# Patient Record
Sex: Female | Born: 1970 | Race: White | Hispanic: No | Marital: Married | Smoking: Never smoker
Health system: Southern US, Community
[De-identification: ages and names within clinical notes are randomized; demographics above are authoritative.]

## PROBLEM LIST (undated history)

## (undated) DIAGNOSIS — I10 Essential (primary) hypertension: Secondary | ICD-10-CM

## (undated) DIAGNOSIS — E785 Hyperlipidemia, unspecified: Secondary | ICD-10-CM

## (undated) DIAGNOSIS — M1990 Unspecified osteoarthritis, unspecified site: Secondary | ICD-10-CM

---

## 2014-11-10 ENCOUNTER — Ambulatory Visit (HOSPITAL_COMMUNITY)
Admission: RE | Admit: 2014-11-10 | Discharge: 2014-11-10 | Disposition: A | Discharge status: Home / Self Care | Payer: BLUE CROSS/BLUE SHIELD | Source: Ambulatory Visit | Attending: Family Medicine | Admitting: Family Medicine

## 2014-11-10 ENCOUNTER — Other Ambulatory Visit (HOSPITAL_COMMUNITY): Payer: Self-pay | Admitting: Family Medicine

## 2014-11-10 DIAGNOSIS — R05 Cough: Secondary | ICD-10-CM | POA: Insufficient documentation

## 2014-11-10 DIAGNOSIS — R059 Cough, unspecified: Secondary | ICD-10-CM

## 2015-10-12 ENCOUNTER — Other Ambulatory Visit: Payer: Self-pay | Admitting: Obstetrics and Gynecology

## 2015-10-12 DIAGNOSIS — R928 Other abnormal and inconclusive findings on diagnostic imaging of breast: Secondary | ICD-10-CM

## 2015-10-16 ENCOUNTER — Ambulatory Visit
Admission: RE | Admit: 2015-10-16 | Discharge: 2015-10-16 | Disposition: A | Discharge status: Home / Self Care | Payer: BLUE CROSS/BLUE SHIELD | Source: Ambulatory Visit | Attending: Obstetrics and Gynecology | Admitting: Obstetrics and Gynecology

## 2015-10-16 DIAGNOSIS — N6489 Other specified disorders of breast: Secondary | ICD-10-CM | POA: Diagnosis not present

## 2015-10-16 DIAGNOSIS — R928 Other abnormal and inconclusive findings on diagnostic imaging of breast: Secondary | ICD-10-CM

## 2016-06-22 DIAGNOSIS — E782 Mixed hyperlipidemia: Secondary | ICD-10-CM | POA: Diagnosis not present

## 2016-06-22 DIAGNOSIS — Z6835 Body mass index (BMI) 35.0-35.9, adult: Secondary | ICD-10-CM | POA: Diagnosis not present

## 2016-06-22 DIAGNOSIS — Z1389 Encounter for screening for other disorder: Secondary | ICD-10-CM | POA: Diagnosis not present

## 2016-06-22 DIAGNOSIS — E6609 Other obesity due to excess calories: Secondary | ICD-10-CM | POA: Diagnosis not present

## 2016-06-22 DIAGNOSIS — I10 Essential (primary) hypertension: Secondary | ICD-10-CM | POA: Diagnosis not present

## 2016-09-22 ENCOUNTER — Other Ambulatory Visit: Payer: Self-pay | Admitting: Obstetrics and Gynecology

## 2016-09-22 DIAGNOSIS — N6489 Other specified disorders of breast: Secondary | ICD-10-CM

## 2016-10-05 ENCOUNTER — Ambulatory Visit
Admission: RE | Admit: 2016-10-05 | Discharge: 2016-10-05 | Disposition: A | Discharge status: Home / Self Care | Payer: BLUE CROSS/BLUE SHIELD | Source: Ambulatory Visit | Attending: Obstetrics and Gynecology | Admitting: Obstetrics and Gynecology

## 2016-10-05 DIAGNOSIS — R922 Inconclusive mammogram: Secondary | ICD-10-CM | POA: Diagnosis not present

## 2016-10-05 DIAGNOSIS — N6489 Other specified disorders of breast: Secondary | ICD-10-CM

## 2016-10-25 DIAGNOSIS — Z01419 Encounter for gynecological examination (general) (routine) without abnormal findings: Secondary | ICD-10-CM | POA: Diagnosis not present

## 2016-10-25 DIAGNOSIS — R319 Hematuria, unspecified: Secondary | ICD-10-CM | POA: Diagnosis not present

## 2016-10-25 DIAGNOSIS — Z6835 Body mass index (BMI) 35.0-35.9, adult: Secondary | ICD-10-CM | POA: Diagnosis not present

## 2016-11-23 DIAGNOSIS — F419 Anxiety disorder, unspecified: Secondary | ICD-10-CM | POA: Diagnosis not present

## 2016-11-23 DIAGNOSIS — Z1389 Encounter for screening for other disorder: Secondary | ICD-10-CM | POA: Diagnosis not present

## 2016-11-23 DIAGNOSIS — Z6834 Body mass index (BMI) 34.0-34.9, adult: Secondary | ICD-10-CM | POA: Diagnosis not present

## 2016-11-23 DIAGNOSIS — I10 Essential (primary) hypertension: Secondary | ICD-10-CM | POA: Diagnosis not present

## 2016-11-23 DIAGNOSIS — F329 Major depressive disorder, single episode, unspecified: Secondary | ICD-10-CM | POA: Diagnosis not present

## 2017-02-27 DIAGNOSIS — M79605 Pain in left leg: Secondary | ICD-10-CM | POA: Diagnosis not present

## 2017-02-27 DIAGNOSIS — R202 Paresthesia of skin: Secondary | ICD-10-CM | POA: Diagnosis not present

## 2017-02-27 DIAGNOSIS — E6609 Other obesity due to excess calories: Secondary | ICD-10-CM | POA: Diagnosis not present

## 2017-02-27 DIAGNOSIS — Z1389 Encounter for screening for other disorder: Secondary | ICD-10-CM | POA: Diagnosis not present

## 2017-02-27 DIAGNOSIS — Z6834 Body mass index (BMI) 34.0-34.9, adult: Secondary | ICD-10-CM | POA: Diagnosis not present

## 2017-03-09 DIAGNOSIS — Z1389 Encounter for screening for other disorder: Secondary | ICD-10-CM | POA: Diagnosis not present

## 2017-03-09 DIAGNOSIS — M79605 Pain in left leg: Secondary | ICD-10-CM | POA: Diagnosis not present

## 2017-03-09 DIAGNOSIS — R202 Paresthesia of skin: Secondary | ICD-10-CM | POA: Diagnosis not present

## 2017-03-09 DIAGNOSIS — Z6834 Body mass index (BMI) 34.0-34.9, adult: Secondary | ICD-10-CM | POA: Diagnosis not present

## 2017-03-09 DIAGNOSIS — M79662 Pain in left lower leg: Secondary | ICD-10-CM | POA: Diagnosis not present

## 2017-03-14 DIAGNOSIS — M79605 Pain in left leg: Secondary | ICD-10-CM | POA: Diagnosis not present

## 2017-03-14 DIAGNOSIS — M79669 Pain in unspecified lower leg: Secondary | ICD-10-CM | POA: Diagnosis not present

## 2017-03-14 DIAGNOSIS — Z6834 Body mass index (BMI) 34.0-34.9, adult: Secondary | ICD-10-CM | POA: Diagnosis not present

## 2017-03-14 DIAGNOSIS — R202 Paresthesia of skin: Secondary | ICD-10-CM | POA: Diagnosis not present

## 2017-03-14 DIAGNOSIS — Z1389 Encounter for screening for other disorder: Secondary | ICD-10-CM | POA: Diagnosis not present

## 2017-03-29 DIAGNOSIS — I10 Essential (primary) hypertension: Secondary | ICD-10-CM | POA: Diagnosis not present

## 2017-03-29 DIAGNOSIS — M5416 Radiculopathy, lumbar region: Secondary | ICD-10-CM | POA: Diagnosis not present

## 2017-03-29 DIAGNOSIS — R208 Other disturbances of skin sensation: Secondary | ICD-10-CM | POA: Diagnosis not present

## 2017-03-29 DIAGNOSIS — E785 Hyperlipidemia, unspecified: Secondary | ICD-10-CM | POA: Diagnosis not present

## 2017-04-20 DIAGNOSIS — M5416 Radiculopathy, lumbar region: Secondary | ICD-10-CM | POA: Diagnosis not present

## 2017-04-20 DIAGNOSIS — G603 Idiopathic progressive neuropathy: Secondary | ICD-10-CM | POA: Diagnosis not present

## 2017-05-23 DIAGNOSIS — M2550 Pain in unspecified joint: Secondary | ICD-10-CM | POA: Diagnosis not present

## 2017-05-23 DIAGNOSIS — F419 Anxiety disorder, unspecified: Secondary | ICD-10-CM | POA: Diagnosis not present

## 2017-05-23 DIAGNOSIS — E782 Mixed hyperlipidemia: Secondary | ICD-10-CM | POA: Diagnosis not present

## 2017-05-23 DIAGNOSIS — E785 Hyperlipidemia, unspecified: Secondary | ICD-10-CM | POA: Diagnosis not present

## 2017-05-23 DIAGNOSIS — G608 Other hereditary and idiopathic neuropathies: Secondary | ICD-10-CM | POA: Diagnosis not present

## 2017-05-23 DIAGNOSIS — Z6835 Body mass index (BMI) 35.0-35.9, adult: Secondary | ICD-10-CM | POA: Diagnosis not present

## 2017-05-23 DIAGNOSIS — I10 Essential (primary) hypertension: Secondary | ICD-10-CM | POA: Diagnosis not present

## 2017-05-23 DIAGNOSIS — Z1389 Encounter for screening for other disorder: Secondary | ICD-10-CM | POA: Diagnosis not present

## 2017-10-11 NOTE — Progress Notes (Deleted)
   Office Visit Note  Patient: Shelby Coleman             Date of Birth: 02/20/1971           MRN: [id number]             PCP: Assunta FoundGolding, John, MD Referring: Assunta FoundGolding, John, MD Visit Date: 10/25/2017 Occupation: @GUAROCC @    Subjective:  No chief complaint on file.   History of Present Illness: Shelby Coleman is a 47 y.o. female ***   Activities of Daily Living:  Patient reports morning stiffness for *** {minute/hour:19697}.   Patient {ACTIONS;DENIES/REPORTS:21021675::"Denies"} nocturnal pain.  Difficulty dressing/grooming: {ACTIONS;DENIES/REPORTS:21021675::"Denies"} Difficulty climbing stairs: {ACTIONS;DENIES/REPORTS:21021675::"Denies"} Difficulty getting out of chair: {ACTIONS;DENIES/REPORTS:21021675::"Denies"} Difficulty using hands for taps, buttons, cutlery, and/or writing: {ACTIONS;DENIES/REPORTS:21021675::"Denies"}   No Rheumatology ROS completed.   PMFS History:  There are no active problems to display for this patient.   No past medical history on file.  Family History  Problem Relation Age of Onset  . Breast cancer Maternal Grandmother    *** The histories are not reviewed yet. Please review them in the "History" navigator section and refresh this SmartLink. Social History   Social History Narrative  . Not on file     Objective: Vital Signs: There were no vitals taken for this visit.   Physical Exam   Musculoskeletal Exam: ***  CDAI Exam: No CDAI exam completed.    Investigation: No additional findings.   Imaging: No results found.  Speciality Comments: No specialty comments available.    Procedures:  No procedures performed Allergies: Patient has no allergy information on record.   Assessment / Plan:     Visit Diagnoses: Polyarthralgia - on Mobic 15 mg dailyLabs were not included  Essential hypertension  History of hyperlipidemia  History of anxiety  Idiopathic peripheral neuropathy    Orders: No orders of the defined types  were placed in this encounter.  No orders of the defined types were placed in this encounter.   Face-to-face time spent with patient was *** minutes. 50% of time was spent in counseling and coordination of care.  Follow-Up Instructions: No follow-ups on file.   Gearldine Bienenstockaylor M Maylon Sailors, PA-C  Note - This record has been created using Dragon software.  Chart creation errors have been sought, but may not always  have been located. Such creation errors do not reflect on  the standard of medical care.

## 2017-10-25 ENCOUNTER — Ambulatory Visit: Payer: Self-pay | Admitting: Rheumatology

## 2017-11-13 NOTE — Progress Notes (Deleted)
   Office Visit Note  Patient: Shelby Coleman             Date of Birth: [birth year]           MRN: [id number]             PCP: Assunta Found, MD Referring: Assunta Found, MD Visit Date: 11/27/2017 Occupation: @    Subjective:  No chief complaint on file.   History of Present Illness: Shelby Coleman is a 47 y.o. female ***   Activities of Daily Living:  Patient reports morning stiffness for *** {minute/hour:19697}.   Patient {ACTIONS;DENIES/REPORTS:21021675::"Denies"} nocturnal pain.  Difficulty dressing/grooming: {ACTIONS;DENIES/REPORTS:21021675::"Denies"} Difficulty climbing stairs: {ACTIONS;DENIES/REPORTS:21021675::"Denies"} Difficulty getting out of chair: {ACTIONS;DENIES/REPORTS:21021675::"Denies"} Difficulty using hands for taps, buttons, cutlery, and/or writing: {ACTIONS;DENIES/REPORTS:21021675::"Denies"}   No Rheumatology ROS completed.   PMFS History:  There are no active problems to display for this patient.   No past medical history on file.  Family History  Problem Relation Age of Onset  . Breast cancer Maternal Grandmother    *** The histories are not reviewed yet. Please review them in the "History" navigator section and refresh this SmartLink. Social History   Social History Narrative  . Not on file     Objective: Vital Signs: There were no vitals taken for this visit.   Physical Exam   Musculoskeletal Exam: ***  CDAI Exam: No CDAI exam completed.    Investigation: No additional findings.   Imaging: No results found.  Speciality Comments: No specialty comments available.    Procedures:  No procedures performed Allergies: Patient has no allergy information on record.   Assessment / Plan:     Visit Diagnoses: Polyarthralgia  Idiopathic peripheral neuropathy  History of hypertension  History of hyperlipidemia  History of anxiety    Orders: No orders of the defined types were placed in this encounter.  No orders of  the defined types were placed in this encounter.   Face-to-face time spent with patient was *** minutes. 50% of time was spent in counseling and coordination of care.  Follow-Up Instructions: No follow-ups on file.   Gearldine Bienenstock, PA-C  Note - This record has been created using Dragon software.  Chart creation errors have been sought, but may not always  have been located. Such creation errors do not reflect on  the standard of medical care.

## 2017-11-23 ENCOUNTER — Ambulatory Visit: Payer: Self-pay | Admitting: Rheumatology

## 2017-11-27 ENCOUNTER — Ambulatory Visit: Payer: Self-pay | Admitting: Rheumatology

## 2017-12-28 ENCOUNTER — Ambulatory Visit: Payer: Self-pay | Admitting: Physician Assistant

## 2018-05-23 DIAGNOSIS — E6609 Other obesity due to excess calories: Secondary | ICD-10-CM | POA: Diagnosis not present

## 2018-05-23 DIAGNOSIS — Z0000 Encounter for general adult medical examination without abnormal findings: Secondary | ICD-10-CM | POA: Diagnosis not present

## 2018-05-23 DIAGNOSIS — J029 Acute pharyngitis, unspecified: Secondary | ICD-10-CM | POA: Diagnosis not present

## 2018-05-23 DIAGNOSIS — Z1389 Encounter for screening for other disorder: Secondary | ICD-10-CM | POA: Diagnosis not present

## 2018-05-23 DIAGNOSIS — Z6835 Body mass index (BMI) 35.0-35.9, adult: Secondary | ICD-10-CM | POA: Diagnosis not present

## 2018-05-23 DIAGNOSIS — Z0001 Encounter for general adult medical examination with abnormal findings: Secondary | ICD-10-CM | POA: Diagnosis not present

## 2018-07-13 ENCOUNTER — Other Ambulatory Visit: Payer: Self-pay | Admitting: Obstetrics and Gynecology

## 2018-07-13 DIAGNOSIS — N6489 Other specified disorders of breast: Secondary | ICD-10-CM

## 2018-07-20 ENCOUNTER — Ambulatory Visit
Admission: RE | Admit: 2018-07-20 | Discharge: 2018-07-20 | Disposition: A | Discharge status: Home / Self Care | Payer: BLUE CROSS/BLUE SHIELD | Source: Ambulatory Visit | Attending: Obstetrics and Gynecology | Admitting: Obstetrics and Gynecology

## 2018-07-20 ENCOUNTER — Other Ambulatory Visit: Payer: Self-pay | Admitting: Obstetrics and Gynecology

## 2018-07-20 DIAGNOSIS — R922 Inconclusive mammogram: Secondary | ICD-10-CM | POA: Diagnosis not present

## 2018-07-20 DIAGNOSIS — N6489 Other specified disorders of breast: Secondary | ICD-10-CM

## 2018-07-26 DIAGNOSIS — R0683 Snoring: Secondary | ICD-10-CM | POA: Diagnosis not present

## 2018-08-08 DIAGNOSIS — G4733 Obstructive sleep apnea (adult) (pediatric): Secondary | ICD-10-CM | POA: Diagnosis not present

## 2018-08-14 DIAGNOSIS — Z6834 Body mass index (BMI) 34.0-34.9, adult: Secondary | ICD-10-CM | POA: Diagnosis not present

## 2018-08-14 DIAGNOSIS — E6609 Other obesity due to excess calories: Secondary | ICD-10-CM | POA: Diagnosis not present

## 2018-08-14 DIAGNOSIS — R6883 Chills (without fever): Secondary | ICD-10-CM | POA: Diagnosis not present

## 2018-08-14 DIAGNOSIS — J019 Acute sinusitis, unspecified: Secondary | ICD-10-CM | POA: Diagnosis not present

## 2018-08-14 DIAGNOSIS — J343 Hypertrophy of nasal turbinates: Secondary | ICD-10-CM | POA: Diagnosis not present

## 2018-08-14 DIAGNOSIS — R05 Cough: Secondary | ICD-10-CM | POA: Diagnosis not present

## 2018-08-14 DIAGNOSIS — E781 Pure hyperglyceridemia: Secondary | ICD-10-CM | POA: Diagnosis not present

## 2018-08-14 DIAGNOSIS — Z1389 Encounter for screening for other disorder: Secondary | ICD-10-CM | POA: Diagnosis not present

## 2018-08-20 DIAGNOSIS — G4733 Obstructive sleep apnea (adult) (pediatric): Secondary | ICD-10-CM | POA: Diagnosis not present

## 2018-09-12 DIAGNOSIS — Z3202 Encounter for pregnancy test, result negative: Secondary | ICD-10-CM | POA: Diagnosis not present

## 2018-09-12 DIAGNOSIS — Z6836 Body mass index (BMI) 36.0-36.9, adult: Secondary | ICD-10-CM | POA: Diagnosis not present

## 2018-09-12 DIAGNOSIS — Z01419 Encounter for gynecological examination (general) (routine) without abnormal findings: Secondary | ICD-10-CM | POA: Diagnosis not present

## 2018-09-18 DIAGNOSIS — G4733 Obstructive sleep apnea (adult) (pediatric): Secondary | ICD-10-CM | POA: Diagnosis not present

## 2018-10-19 DIAGNOSIS — G4733 Obstructive sleep apnea (adult) (pediatric): Secondary | ICD-10-CM | POA: Diagnosis not present

## 2018-11-18 DIAGNOSIS — G4733 Obstructive sleep apnea (adult) (pediatric): Secondary | ICD-10-CM | POA: Diagnosis not present

## 2018-12-19 DIAGNOSIS — G4733 Obstructive sleep apnea (adult) (pediatric): Secondary | ICD-10-CM | POA: Diagnosis not present

## 2019-01-12 ENCOUNTER — Emergency Department (HOSPITAL_COMMUNITY): Payer: BC Managed Care – PPO | Admitting: Anesthesiology

## 2019-01-12 ENCOUNTER — Encounter (HOSPITAL_COMMUNITY)
Admission: EM | Disposition: A | Discharge status: Home / Self Care | Payer: Self-pay | Source: Home / Self Care | Attending: Emergency Medicine

## 2019-01-12 ENCOUNTER — Emergency Department (HOSPITAL_COMMUNITY): Payer: BC Managed Care – PPO

## 2019-01-12 ENCOUNTER — Other Ambulatory Visit: Payer: Self-pay

## 2019-01-12 ENCOUNTER — Encounter (HOSPITAL_COMMUNITY): Payer: Self-pay | Admitting: Emergency Medicine

## 2019-01-12 ENCOUNTER — Ambulatory Visit (HOSPITAL_COMMUNITY)
Admission: EM | Admit: 2019-01-12 | Discharge: 2019-01-13 | Disposition: A | Discharge status: Home / Self Care | Payer: BC Managed Care – PPO | Attending: Emergency Medicine | Admitting: Emergency Medicine

## 2019-01-12 DIAGNOSIS — Z79899 Other long term (current) drug therapy: Secondary | ICD-10-CM | POA: Insufficient documentation

## 2019-01-12 DIAGNOSIS — S82892A Other fracture of left lower leg, initial encounter for closed fracture: Secondary | ICD-10-CM | POA: Diagnosis not present

## 2019-01-12 DIAGNOSIS — J45909 Unspecified asthma, uncomplicated: Secondary | ICD-10-CM | POA: Insufficient documentation

## 2019-01-12 DIAGNOSIS — Z791 Long term (current) use of non-steroidal anti-inflammatories (NSAID): Secondary | ICD-10-CM | POA: Diagnosis not present

## 2019-01-12 DIAGNOSIS — G4730 Sleep apnea, unspecified: Secondary | ICD-10-CM | POA: Insufficient documentation

## 2019-01-12 DIAGNOSIS — S8262XA Displaced fracture of lateral malleolus of left fibula, initial encounter for closed fracture: Secondary | ICD-10-CM

## 2019-01-12 DIAGNOSIS — E785 Hyperlipidemia, unspecified: Secondary | ICD-10-CM | POA: Insufficient documentation

## 2019-01-12 DIAGNOSIS — Z6835 Body mass index (BMI) 35.0-35.9, adult: Secondary | ICD-10-CM | POA: Insufficient documentation

## 2019-01-12 DIAGNOSIS — Z803 Family history of malignant neoplasm of breast: Secondary | ICD-10-CM | POA: Insufficient documentation

## 2019-01-12 DIAGNOSIS — S89392A Other physeal fracture of lower end of left fibula, initial encounter for closed fracture: Secondary | ICD-10-CM | POA: Diagnosis not present

## 2019-01-12 DIAGNOSIS — W19XXXA Unspecified fall, initial encounter: Secondary | ICD-10-CM | POA: Diagnosis not present

## 2019-01-12 DIAGNOSIS — M25372 Other instability, left ankle: Secondary | ICD-10-CM | POA: Diagnosis not present

## 2019-01-12 DIAGNOSIS — S82832A Other fracture of upper and lower end of left fibula, initial encounter for closed fracture: Secondary | ICD-10-CM | POA: Diagnosis not present

## 2019-01-12 DIAGNOSIS — M25552 Pain in left hip: Secondary | ICD-10-CM | POA: Diagnosis not present

## 2019-01-12 DIAGNOSIS — Z7982 Long term (current) use of aspirin: Secondary | ICD-10-CM | POA: Diagnosis not present

## 2019-01-12 DIAGNOSIS — S79912A Unspecified injury of left hip, initial encounter: Secondary | ICD-10-CM | POA: Diagnosis not present

## 2019-01-12 DIAGNOSIS — M25562 Pain in left knee: Secondary | ICD-10-CM | POA: Diagnosis not present

## 2019-01-12 DIAGNOSIS — Z1159 Encounter for screening for other viral diseases: Secondary | ICD-10-CM | POA: Diagnosis not present

## 2019-01-12 DIAGNOSIS — E669 Obesity, unspecified: Secondary | ICD-10-CM | POA: Insufficient documentation

## 2019-01-12 DIAGNOSIS — S8263XA Displaced fracture of lateral malleolus of unspecified fibula, initial encounter for closed fracture: Secondary | ICD-10-CM | POA: Diagnosis present

## 2019-01-12 DIAGNOSIS — S8992XA Unspecified injury of left lower leg, initial encounter: Secondary | ICD-10-CM | POA: Diagnosis not present

## 2019-01-12 DIAGNOSIS — I10 Essential (primary) hypertension: Secondary | ICD-10-CM | POA: Insufficient documentation

## 2019-01-12 DIAGNOSIS — M1990 Unspecified osteoarthritis, unspecified site: Secondary | ICD-10-CM | POA: Insufficient documentation

## 2019-01-12 DIAGNOSIS — Z03818 Encounter for observation for suspected exposure to other biological agents ruled out: Secondary | ICD-10-CM | POA: Diagnosis not present

## 2019-01-12 HISTORY — DX: Hyperlipidemia, unspecified: E78.5

## 2019-01-12 HISTORY — PX: ORIF ANKLE FRACTURE: SHX5408

## 2019-01-12 HISTORY — DX: Unspecified osteoarthritis, unspecified site: M19.90

## 2019-01-12 HISTORY — DX: Essential (primary) hypertension: I10

## 2019-01-12 LAB — BASIC METABOLIC PANEL
Anion gap: 10 (ref 5–15)
BUN: 11 mg/dL (ref 6–20)
CO2: 21 mmol/L — ABNORMAL LOW (ref 22–32)
Calcium: 9.3 mg/dL (ref 8.9–10.3)
Chloride: 106 mmol/L (ref 98–111)
Creatinine, Ser: 0.96 mg/dL (ref 0.44–1.00)
GFR calc Af Amer: >60 mL/min (ref >60)
GFR calc non Af Amer: >60 mL/min (ref >60)
Glucose, Bld: 103 mg/dL — ABNORMAL HIGH (ref 70–99)
Potassium: 3.9 mmol/L (ref 3.5–5.1)
Sodium: 137 mmol/L (ref 135–145)

## 2019-01-12 LAB — CBC
HCT: 39.5 % (ref 36.0–46.0)
Hemoglobin: 13.7 g/dL (ref 12.0–15.0)
MCH: 29.9 pg (ref 26.0–34.0)
MCHC: 34.7 g/dL (ref 30.0–36.0)
MCV: 86.2 fL (ref 80.0–100.0)
Platelets: 318 10*3/uL (ref 150–400)
RBC: 4.58 MIL/uL (ref 3.87–5.11)
RDW: 13.2 % (ref 11.5–15.5)
WBC: 16.3 10*3/uL — ABNORMAL HIGH (ref 4.0–10.5)
nRBC: 0 % (ref 0.0–0.2)

## 2019-01-12 LAB — SARS CORONAVIRUS 2 BY RT PCR (HOSPITAL ORDER, PERFORMED IN ~~LOC~~ HOSPITAL LAB): SARS Coronavirus 2: NEGATIVE

## 2019-01-12 SURGERY — OPEN REDUCTION INTERNAL FIXATION (ORIF) ANKLE FRACTURE
Anesthesia: General | Site: Ankle | Laterality: Left

## 2019-01-12 MED ORDER — SCOPOLAMINE 1 MG/3DAYS TD PT72
MEDICATED_PATCH | TRANSDERMAL | Status: DC | PRN
  Administered 2019-01-12: 1 via TRANSDERMAL

## 2019-01-12 MED ORDER — FENTANYL CITRATE (PF) 250 MCG/5ML IJ SOLN
INTRAMUSCULAR | Status: AC
  Filled 2019-01-12: qty 5

## 2019-01-12 MED ORDER — ONDANSETRON HCL 4 MG/2ML IJ SOLN: 4.0000 mg | Freq: Four times a day (QID) | INTRAMUSCULAR | Status: DC | PRN

## 2019-01-12 MED ORDER — CHLORHEXIDINE GLUCONATE 4 % EX LIQD: 60.0000 mL | Freq: Once | CUTANEOUS | Status: DC

## 2019-01-12 MED ORDER — ESCITALOPRAM OXALATE 10 MG PO TABS
20.0000 mg | ORAL_TABLET | Freq: Every day | ORAL | Status: DC
  Administered 2019-01-13: 09:00:00 20 mg via ORAL
  Filled 2019-01-12: qty 2

## 2019-01-12 MED ORDER — 0.9 % SODIUM CHLORIDE (POUR BTL) OPTIME
TOPICAL | Status: DC | PRN
  Administered 2019-01-12: 17:00:00 1000 mL

## 2019-01-12 MED ORDER — BISACODYL 10 MG RE SUPP: 10.0000 mg | Freq: Every day | RECTAL | Status: DC | PRN

## 2019-01-12 MED ORDER — METHOCARBAMOL 500 MG PO TABS
500.0000 mg | ORAL_TABLET | Freq: Four times a day (QID) | ORAL | Status: DC | PRN
  Filled 2019-01-12: qty 1

## 2019-01-12 MED ORDER — METOCLOPRAMIDE HCL 5 MG/ML IJ SOLN: 5.0000 mg | Freq: Three times a day (TID) | INTRAMUSCULAR | Status: DC | PRN

## 2019-01-12 MED ORDER — SCOPOLAMINE 1 MG/3DAYS TD PT72
1.0000 | MEDICATED_PATCH | Freq: Once | TRANSDERMAL | Status: DC
  Administered 2019-01-13: 1.5 mg via TRANSDERMAL
  Filled 2019-01-12 (×2): qty 1

## 2019-01-12 MED ORDER — OXYCODONE HCL 5 MG PO TABS: 10.0000 mg | ORAL_TABLET | ORAL | Status: DC | PRN

## 2019-01-12 MED ORDER — CELECOXIB 200 MG PO CAPS
ORAL_CAPSULE | ORAL | Status: AC
  Administered 2019-01-12: 17:00:00 200 mg via ORAL
  Filled 2019-01-12: qty 1

## 2019-01-12 MED ORDER — POLYETHYLENE GLYCOL 3350 17 G PO PACK: 17.0000 g | PACK | Freq: Every day | ORAL | Status: DC | PRN

## 2019-01-12 MED ORDER — CEFAZOLIN SODIUM-DEXTROSE 2-4 GM/100ML-% IV SOLN
INTRAVENOUS | Status: AC
  Filled 2019-01-12: qty 100

## 2019-01-12 MED ORDER — CEFAZOLIN SODIUM-DEXTROSE 2-3 GM-%(50ML) IV SOLR
INTRAVENOUS | Status: DC | PRN
  Administered 2019-01-12: 2 g via INTRAVENOUS

## 2019-01-12 MED ORDER — SCOPOLAMINE 1 MG/3DAYS TD PT72
MEDICATED_PATCH | TRANSDERMAL | Status: AC
  Filled 2019-01-12: qty 1

## 2019-01-12 MED ORDER — OXYCODONE HCL 5 MG PO TABS: 5.0000 mg | ORAL_TABLET | ORAL | Status: DC | PRN

## 2019-01-12 MED ORDER — FENTANYL CITRATE (PF) 100 MCG/2ML IJ SOLN
25.0000 ug | INTRAMUSCULAR | Status: DC | PRN
  Administered 2019-01-12 (×2): 50 ug via INTRAVENOUS

## 2019-01-12 MED ORDER — FENTANYL CITRATE (PF) 100 MCG/2ML IJ SOLN
INTRAMUSCULAR | Status: DC | PRN
  Administered 2019-01-12: 150 ug via INTRAVENOUS

## 2019-01-12 MED ORDER — ASPIRIN EC 325 MG PO TBEC
325.0000 mg | DELAYED_RELEASE_TABLET | Freq: Every day | ORAL | Status: DC
  Administered 2019-01-12 – 2019-01-13 (×2): 325 mg via ORAL
  Filled 2019-01-12: qty 1

## 2019-01-12 MED ORDER — ENSURE PRE-SURGERY PO LIQD
296.0000 mL | Freq: Once | ORAL | Status: DC
  Filled 2019-01-12: qty 296

## 2019-01-12 MED ORDER — ONDANSETRON HCL 4 MG PO TABS: 4.0000 mg | ORAL_TABLET | Freq: Four times a day (QID) | ORAL | Status: DC | PRN

## 2019-01-12 MED ORDER — MIDAZOLAM HCL 2 MG/2ML IJ SOLN
INTRAMUSCULAR | Status: AC
  Filled 2019-01-12: qty 2

## 2019-01-12 MED ORDER — ACETAMINOPHEN 500 MG PO TABS
1000.0000 mg | ORAL_TABLET | Freq: Once | ORAL | Status: AC
  Administered 2019-01-12: 22:00:00 1000 mg via ORAL

## 2019-01-12 MED ORDER — ACETAMINOPHEN 500 MG PO TABS
ORAL_TABLET | ORAL | Status: AC
  Filled 2019-01-12: qty 2

## 2019-01-12 MED ORDER — DOCUSATE SODIUM 100 MG PO CAPS
100.0000 mg | ORAL_CAPSULE | Freq: Two times a day (BID) | ORAL | Status: DC
  Administered 2019-01-12 – 2019-01-13 (×2): 100 mg via ORAL
  Filled 2019-01-12 (×2): qty 1

## 2019-01-12 MED ORDER — METHOCARBAMOL 1000 MG/10ML IJ SOLN
500.0000 mg | Freq: Four times a day (QID) | INTRAVENOUS | Status: DC | PRN
  Filled 2019-01-12: qty 5

## 2019-01-12 MED ORDER — CEFAZOLIN SODIUM-DEXTROSE 2-4 GM/100ML-% IV SOLN: 2.0000 g | INTRAVENOUS | Status: AC

## 2019-01-12 MED ORDER — CELECOXIB 200 MG PO CAPS
200.0000 mg | ORAL_CAPSULE | Freq: Once | ORAL | Status: AC
  Administered 2019-01-12: 200 mg via ORAL

## 2019-01-12 MED ORDER — LACTATED RINGERS IV SOLN
INTRAVENOUS | Status: DC | PRN
  Administered 2019-01-12: 17:00:00 via INTRAVENOUS

## 2019-01-12 MED ORDER — LIDOCAINE 2% (20 MG/ML) 5 ML SYRINGE
INTRAMUSCULAR | Status: DC | PRN
  Administered 2019-01-12: 100 mg via INTRAVENOUS

## 2019-01-12 MED ORDER — SUGAMMADEX SODIUM 200 MG/2ML IV SOLN
INTRAVENOUS | Status: DC | PRN
  Administered 2019-01-12: 181.4 mg via INTRAVENOUS

## 2019-01-12 MED ORDER — ACETAMINOPHEN 325 MG PO TABS
ORAL_TABLET | ORAL | Status: DC | PRN
  Administered 2019-01-12: 1000 mg via ORAL

## 2019-01-12 MED ORDER — LOSARTAN POTASSIUM 50 MG PO TABS
100.0000 mg | ORAL_TABLET | Freq: Every day | ORAL | Status: DC
  Administered 2019-01-13: 09:00:00 100 mg via ORAL
  Filled 2019-01-12: qty 2

## 2019-01-12 MED ORDER — CEFAZOLIN SODIUM-DEXTROSE 1-4 GM/50ML-% IV SOLN
1.0000 g | Freq: Four times a day (QID) | INTRAVENOUS | Status: AC
  Administered 2019-01-13 (×3): 1 g via INTRAVENOUS
  Filled 2019-01-12 (×3): qty 50

## 2019-01-12 MED ORDER — PROPOFOL 10 MG/ML IV BOLUS
INTRAVENOUS | Status: DC | PRN
  Administered 2019-01-12: 150 mg via INTRAVENOUS

## 2019-01-12 MED ORDER — ROCURONIUM BROMIDE 100 MG/10ML IV SOLN
INTRAVENOUS | Status: DC | PRN
  Administered 2019-01-12: 100 mg via INTRAVENOUS

## 2019-01-12 MED ORDER — METOCLOPRAMIDE HCL 5 MG PO TABS: 5.0000 mg | ORAL_TABLET | Freq: Three times a day (TID) | ORAL | Status: DC | PRN

## 2019-01-12 MED ORDER — PROPOFOL 10 MG/ML IV BOLUS
INTRAVENOUS | Status: AC
  Filled 2019-01-12: qty 40

## 2019-01-12 MED ORDER — MAGNESIUM CITRATE PO SOLN: 1.0000 | Freq: Once | ORAL | Status: DC | PRN

## 2019-01-12 MED ORDER — AMLODIPINE BESYLATE 10 MG PO TABS
10.0000 mg | ORAL_TABLET | Freq: Every day | ORAL | Status: DC
  Administered 2019-01-13: 10 mg via ORAL
  Filled 2019-01-12: qty 1

## 2019-01-12 MED ORDER — POVIDONE-IODINE 10 % EX SWAB
2.0000 "application " | Freq: Once | CUTANEOUS | Status: AC
  Administered 2019-01-12: 2 via TOPICAL

## 2019-01-12 MED ORDER — ACETAMINOPHEN 325 MG PO TABS: 325.0000 mg | ORAL_TABLET | Freq: Four times a day (QID) | ORAL | Status: DC | PRN

## 2019-01-12 MED ORDER — DEXAMETHASONE SODIUM PHOSPHATE 10 MG/ML IJ SOLN
INTRAMUSCULAR | Status: DC | PRN
  Administered 2019-01-12: 10 mg via INTRAVENOUS

## 2019-01-12 MED ORDER — ACETAMINOPHEN 10 MG/ML IV SOLN
INTRAVENOUS | Status: AC
  Filled 2019-01-12: qty 100

## 2019-01-12 MED ORDER — GABAPENTIN 300 MG PO CAPS
300.0000 mg | ORAL_CAPSULE | Freq: Once | ORAL | Status: AC
  Administered 2019-01-12: 300 mg via ORAL

## 2019-01-12 MED ORDER — HYDROMORPHONE HCL 1 MG/ML IJ SOLN: 0.5000 mg | INTRAMUSCULAR | Status: DC | PRN

## 2019-01-12 MED ORDER — PROMETHAZINE HCL 25 MG/ML IJ SOLN: 6.2500 mg | INTRAMUSCULAR | Status: DC | PRN

## 2019-01-12 MED ORDER — ONDANSETRON HCL 4 MG/2ML IJ SOLN
INTRAMUSCULAR | Status: DC | PRN
  Administered 2019-01-12: 20 mg via INTRAVENOUS
  Administered 2019-01-12: 4 mg via INTRAVENOUS

## 2019-01-12 MED ORDER — HYDROCODONE-ACETAMINOPHEN 5-325 MG PO TABS: 1.0000 | ORAL_TABLET | ORAL | 0 refills | Status: AC | PRN

## 2019-01-12 MED ORDER — GABAPENTIN 300 MG PO CAPS
ORAL_CAPSULE | ORAL | Status: AC
  Administered 2019-01-12: 17:00:00 300 mg via ORAL
  Filled 2019-01-12: qty 1

## 2019-01-12 MED ORDER — FENTANYL CITRATE (PF) 100 MCG/2ML IJ SOLN
INTRAMUSCULAR | Status: AC
  Filled 2019-01-12: qty 2

## 2019-01-12 MED ORDER — ATORVASTATIN CALCIUM 10 MG PO TABS
20.0000 mg | ORAL_TABLET | Freq: Every day | ORAL | Status: DC
  Administered 2019-01-13: 09:00:00 20 mg via ORAL
  Filled 2019-01-12: qty 2

## 2019-01-12 MED ORDER — SODIUM CHLORIDE 0.9 % IV SOLN
INTRAVENOUS | Status: DC
  Administered 2019-01-12: 22:00:00 via INTRAVENOUS

## 2019-01-12 SURGICAL SUPPLY — 47 items
BANDAGE ESMARK 6X9 LF (GAUZE/BANDAGES/DRESSINGS) ×1 IMPLANT
BIT DRILL 2.5X110 QC LCP DISP (BIT) ×3 IMPLANT
BNDG COHESIVE 4X5 TAN STRL (GAUZE/BANDAGES/DRESSINGS) ×3 IMPLANT
BNDG ESMARK 6X9 LF (GAUZE/BANDAGES/DRESSINGS) ×3
BNDG GAUZE ELAST 4 BULKY (GAUZE/BANDAGES/DRESSINGS) ×3 IMPLANT
COVER SURGICAL LIGHT HANDLE (MISCELLANEOUS) ×3 IMPLANT
COVER WAND RF STERILE (DRAPES) ×3 IMPLANT
DRAPE OEC MINIVIEW 54X84 (DRAPES) ×3 IMPLANT
DRAPE U-SHAPE 47X51 STRL (DRAPES) ×3 IMPLANT
DRSG ADAPTIC 3X8 NADH LF (GAUZE/BANDAGES/DRESSINGS) ×3 IMPLANT
DRSG PAD ABDOMINAL 8X10 ST (GAUZE/BANDAGES/DRESSINGS) IMPLANT
DURAPREP 26ML APPLICATOR (WOUND CARE) ×3 IMPLANT
ELECT REM PT RETURN 9FT ADLT (ELECTROSURGICAL) ×3
ELECTRODE REM PT RTRN 9FT ADLT (ELECTROSURGICAL) ×1 IMPLANT
GAUZE SPONGE 4X4 12PLY STRL (GAUZE/BANDAGES/DRESSINGS) IMPLANT
GAUZE SPONGE 4X4 12PLY STRL LF (GAUZE/BANDAGES/DRESSINGS) ×3 IMPLANT
GLOVE BIOGEL PI IND STRL 9 (GLOVE) ×1 IMPLANT
GLOVE BIOGEL PI INDICATOR 9 (GLOVE) ×2
GLOVE SURG ORTHO 9.0 STRL STRW (GLOVE) ×3 IMPLANT
GOWN STRL REUS W/ TWL XL LVL3 (GOWN DISPOSABLE) ×2 IMPLANT
GOWN STRL REUS W/TWL XL LVL3 (GOWN DISPOSABLE) ×4
KIT BASIN OR (CUSTOM PROCEDURE TRAY) ×3 IMPLANT
KIT TURNOVER KIT B (KITS) ×3 IMPLANT
MANIFOLD NEPTUNE II (INSTRUMENTS) IMPLANT
NS IRRIG 1000ML POUR BTL (IV SOLUTION) ×3 IMPLANT
PACK ORTHO EXTREMITY (CUSTOM PROCEDURE TRAY) ×3 IMPLANT
PAD ABD 8X10 STRL (GAUZE/BANDAGES/DRESSINGS) ×3 IMPLANT
PAD ARMBOARD 7.5X6 YLW CONV (MISCELLANEOUS) ×6 IMPLANT
PLATE LCP 3.5 1/3 TUB 7HX81 (Plate) ×3 IMPLANT
SCREW CORTEX 3.5 12MM (Screw) ×6 IMPLANT
SCREW CORTEX 3.5 14MM (Screw) ×2 IMPLANT
SCREW CORTEX 3.5 20MM (Screw) ×2 IMPLANT
SCREW LOCK CORT ST 3.5X12 (Screw) ×3 IMPLANT
SCREW LOCK CORT ST 3.5X14 (Screw) ×1 IMPLANT
SCREW LOCK CORT ST 3.5X20 (Screw) ×1 IMPLANT
SCREW LOCK T15 FT 14X3.5X2.9X (Screw) ×1 IMPLANT
SCREW LOCKING 3.5X14 (Screw) ×2 IMPLANT
STAPLER VISISTAT 35W (STAPLE) ×3 IMPLANT
SUCTION FRAZIER HANDLE 10FR (MISCELLANEOUS) ×2
SUCTION TUBE FRAZIER 10FR DISP (MISCELLANEOUS) ×1 IMPLANT
SUT ETHILON 2 0 PSLX (SUTURE) IMPLANT
SUT VIC AB 2-0 CT1 27 (SUTURE)
SUT VIC AB 2-0 CT1 TAPERPNT 27 (SUTURE) IMPLANT
TOWEL GREEN STERILE (TOWEL DISPOSABLE) ×3 IMPLANT
TOWEL GREEN STERILE FF (TOWEL DISPOSABLE) ×3 IMPLANT
TUBE CONNECTING 12'X1/4 (SUCTIONS) ×1
TUBE CONNECTING 12X1/4 (SUCTIONS) ×2 IMPLANT

## 2019-01-12 NOTE — Anesthesia Preprocedure Evaluation (Addendum)
Anesthesia Evaluation  Patient identified by MRN, date of birth, ID band Patient awake    Reviewed: Allergy & Precautions, NPO status , Patient's Chart, lab work & pertinent test results  Airway Mallampati: III  TM Distance: >3 FB Neck ROM: Full    Dental  (+) Teeth Intact, Dental Advisory Given   Pulmonary asthma , sleep apnea and Continuous Positive Airway Pressure Ventilation ,    Pulmonary exam normal breath sounds clear to auscultation       Cardiovascular hypertension, Pt. on medications Normal cardiovascular exam Rhythm:Regular Rate:Normal     Neuro/Psych negative neurological ROS     GI/Hepatic negative GI ROS, Neg liver ROS,   Endo/Other  Obesity   Renal/GU negative Renal ROS     Musculoskeletal  (+) Arthritis , Right ankle fracture    Abdominal   Peds  Hematology negative hematology ROS (+)   Anesthesia Other Findings Day of surgery medications reviewed with the patient.  Reproductive/Obstetrics                            Anesthesia Physical Anesthesia Plan  ASA: II and emergent  Anesthesia Plan: General   Post-op Pain Management:    Induction: Intravenous  PONV Risk Score and Plan: 3 and Midazolam, Dexamethasone, Ondansetron, Diphenhydramine and Scopolamine patch - Pre-op  Airway Management Planned: Oral ETT  Additional Equipment:   Intra-op Plan:   Post-operative Plan: Extubation in OR  Informed Consent: I have reviewed the patients History and Physical, chart, labs and discussed the procedure including the risks, benefits and alternatives for the proposed anesthesia with the patient or authorized representative who has indicated his/her understanding and acceptance.     Dental advisory given  Plan Discussed with: CRNA  Anesthesia Plan Comments:        Anesthesia Quick Evaluation

## 2019-01-12 NOTE — Anesthesia Procedure Notes (Addendum)
Date/Time: 01/12/2019 5:37 PM Performed by: Eligha Bridegroom, CRNA Pre-anesthesia Checklist: Patient identified, Emergency Drugs available, Suction available, Patient being monitored and Timeout performed Patient Re-evaluated:Patient Re-evaluated prior to induction Oxygen Delivery Method: Circle system utilized Preoxygenation: Pre-oxygenation with 100% oxygen Induction Type: IV induction Grade View: Grade II Tube size: 7.0 mm Number of attempts: 1 Airway Equipment and Method: Bougie stylet Placement Confirmation: positive ETCO2,  ETT inserted through vocal cords under direct vision and breath sounds checked- equal and bilateral Secured at: 22 cm Tube secured with: Tape Dental Injury: Teeth and Oropharynx as per pre-operative assessment  Difficulty Due To: Difficult Airway- due to anterior larynx

## 2019-01-12 NOTE — Consult Note (Signed)
ORTHOPAEDIC CONSULTATION  REQUESTING PHYSICIAN: Melene PlanFloyd, Dan, DO  Chief Complaint: Left ankle pain and deformity.  HPI: Shelby Coleman is a 48 y.o. female who presents with a displaced Weber B left ankle fracture of the fibula.  Patient states she had her right leg up on the counter trying to see if she needed to shave and her left ankle gave out on her falling sustaining the fracture.  Past Medical History:  Diagnosis Date   Arthritis    Hyperlipemia    Hypertension    History reviewed. No pertinent surgical history. Social History   Socioeconomic History   Marital status: Married    Spouse name: Not on file   Number of children: Not on file   Years of education: Not on file   Highest education level: Not on file  Occupational History   Not on file  Social Needs   Financial resource strain: Not on file   Food insecurity    Worry: Not on file    Inability: Not on file   Transportation needs    Medical: Not on file    Non-medical: Not on file  Tobacco Use   Smoking status: Never Smoker   Smokeless tobacco: Never Used  Substance and Sexual Activity   Alcohol use: Not Currently   Drug use: Never   Sexual activity: Not on file  Lifestyle   Physical activity    Days per week: Not on file    Minutes per session: Not on file   Stress: Not on file  Relationships   Social connections    Talks on phone: Not on file    Gets together: Not on file    Attends religious service: Not on file    Active member of club or organization: Not on file    Attends meetings of clubs or organizations: Not on file    Relationship status: Not on file  Other Topics Concern   Not on file  Social History Narrative   Not on file   Family History  Problem Relation Age of Onset   Breast cancer Maternal Grandmother    - negative except otherwise stated in the family history section No Known Allergies Prior to Admission medications   Medication Sig Start Date  End Date Taking? Authorizing Provider  amLODipine (NORVASC) 10 MG tablet Take 10 mg by mouth daily. 11/23/18  Yes [provider]  atorvastatin (LIPITOR) 20 MG tablet Take 20 mg by mouth daily. 11/28/18  Yes [provider]  escitalopram (LEXAPRO) 20 MG tablet Take 20 mg by mouth daily. 10/22/18  Yes [provider]  ibuprofen (ADVIL) 200 MG tablet Take 400 mg by mouth every 6 (six) hours as needed (for pain).   Yes [provider]  losartan (COZAAR) 100 MG tablet Take 100 mg by mouth daily. 10/25/18  Yes [provider]  meloxicam (MOBIC) 15 MG tablet Take 15 mg by mouth daily. 10/22/18  Yes [provider]  HYDROcodone-acetaminophen (NORCO/VICODIN) 5-325 MG tablet Take 1 tablet by mouth every 4 (four) hours as needed. 01/12/19   Jeannie FendMurphy, Laura A, PA-C   Dg Ankle 2 Views Left  Result Date: 01/12/2019 CLINICAL DATA:  Left ankle pain after fall. EXAM: LEFT ANKLE - 2 VIEW COMPARISON:  None. FINDINGS: Moderately displaced oblique fracture is seen involving the distal left fibula. Mild lateral subluxation of the talus relative to distal tibia is noted. IMPRESSION: Moderately displaced distal left fibular fracture. Mild lateral subluxation of the talus  relative to distal tibia. Electronically Signed   By: Marijo Conception M.D.   On: 01/12/2019 14:20   Dg Knee Complete 4 Views Left  Result Date: 01/12/2019 CLINICAL DATA:  Left knee pain after injury. EXAM: LEFT KNEE - COMPLETE 4+ VIEW COMPARISON:  None. FINDINGS: No evidence of fracture, dislocation, or joint effusion. No evidence of arthropathy or other focal bone abnormality. Soft tissues are unremarkable. IMPRESSION: Negative. Electronically Signed   By: Marijo Conception M.D.   On: 01/12/2019 14:22   Dg Hip Unilat With Pelvis 2-3 Views Left  Result Date: 01/12/2019 CLINICAL DATA:  Left hip pain after fall. EXAM: DG HIP (WITH OR WITHOUT PELVIS) 2-3V LEFT COMPARISON:  None. FINDINGS: There is no evidence of hip  fracture or dislocation. There is no evidence of arthropathy or other focal bone abnormality. IMPRESSION: Negative. Electronically Signed   By: Marijo Conception M.D.   On: 01/12/2019 14:23   - pertinent xrays, CT, MRI studies were reviewed and independently interpreted  Positive ROS: All other systems have been reviewed and were otherwise negative with the exception of those mentioned in the HPI and as above.  Physical Exam: General: Alert, no acute distress Psychiatric: Patient is competent for consent with normal mood and affect Lymphatic: No axillary or cervical lymphadenopathy Cardiovascular: No pedal edema Respiratory: No cyanosis, no use of accessory musculature GI: No organomegaly, abdomen is soft and non-tender    Images:  @ENCIMAGES @  Labs:  No results found for: HGBA1C, ESRSEDRATE, CRP, LABURIC, REPTSTATUS, GRAMSTAIN, CULT, LABORGA  No results found for: ALBUMIN, PREALBUMIN, LABURIC  Neurologic: Patient does not have protective sensation bilateral lower extremities.   MUSCULOSKELETAL:   Skin: Examination the skin is intact.  She has good pulses there is a deformity.  Review of the radiographs shows a Weber B fibular fracture with widening of the mortise.  Patient is alert oriented no adenopathy well-dressed normal affect normal respiratory effort she is in mild distress.  Assessment: Assessment: Displaced Weber B left ankle fracture with widening of the mortise.  Plan: Plan: We will plan for open reduction internal fixation of the fibula.  Risks and benefits were discussed including infection neurovascular injury persistent pain arthritis need for additional surgery.  Patient states she understands and wishes to proceed at this time.  Thank you for the consult and the opportunity to see Ms. Deirdre Evener, [place] [phone] 4:03 PM

## 2019-01-12 NOTE — ED Triage Notes (Signed)
Patient states she had her right leg propped up and twisted, ended up falling and now c/o left hip, leg and ankle pain. States she is unable to put any weight on leg. Strong sensation to foot.

## 2019-01-12 NOTE — ED Provider Notes (Signed)
[facility] EMERGENCY [facility]PART[facility] Provider Note   CSN: [id number] Arrival date & time: 01/12/19  1315    History   Chief Complaint Chief Complaint  Patient presents with  . Ankle Injury    HPI Steele BergDarena S Montufar is a 48 y.o. female.     48 year old female presents with complaint of left ankle injury with pain in her knee and hip.  Patient states that she was trying with her leg to see if she needed to shave and accidentally rolled her ankle resulting in her ankle injury.  Patient has been unable to bear weight on her ankle since the injury.  No other injuries, complaints, concerns.     Past Medical History:  Diagnosis Date  . Arthritis   . Hyperlipemia   . Hypertension     There are no active problems to display for this patient.   History reviewed. No pertinent surgical history.   OB History   No obstetric history on file.      Home Medications    Prior to Admission medications   Medication Sig Start Date End Date Taking? Authorizing Provider  HYDROcodone-acetaminophen (NORCO/VICODIN) 5-325 MG tablet Take 1 tablet by mouth every 4 (four) hours as needed. 01/12/19   Jeannie FendMurphy, Tanay Massiah A, PA-C    Family History Family History  Problem Relation Age of Onset  . Breast cancer Maternal Grandmother     Social History Social History   Tobacco Use  . Smoking status: Never Smoker  . Smokeless tobacco: Never Used  Substance Use Topics  . Alcohol use: Not Currently  . Drug use: Never     Allergies   Patient has no known allergies.   Review of Systems Review of Systems  Constitutional: Negative for fever.  Musculoskeletal: Positive for arthralgias, gait problem and joint swelling. Negative for back pain and neck pain.  Skin: Negative for color change, rash and wound.  Allergic/Immunologic: Negative for immunocompromised state.  Neurological: Negative for weakness and numbness.  Hematological: Does not bruise/bleed easily.   Psychiatric/Behavioral: Negative for confusion.  All other systems reviewed and are negative.    Physical Exam Updated Vital Signs BP 124/77   Pulse 81   Temp 98.4 F (36.9 C) (Oral)   Resp 16   Ht 5\' 3"  (1.6 m)   Wt 90.7 kg   LMP 01/08/2019   SpO2 97%   BMI 35.43 kg/m   Physical Exam Vitals signs and nursing note reviewed.  Constitutional:      General: She is not in acute distress.    Appearance: She is well-developed. She is not diaphoretic.  HENT:     Head: Normocephalic and atraumatic.  Cardiovascular:     Pulses: Normal pulses.  Pulmonary:     Effort: Pulmonary effort is normal.  Musculoskeletal:        General: Swelling, tenderness and signs of injury present.     Left knee: She exhibits no swelling, no effusion and no ecchymosis. No tenderness found.     Left ankle: She exhibits decreased range of motion, swelling and ecchymosis. She exhibits no deformity, no laceration and normal pulse. Tenderness. Lateral malleolus and medial malleolus tenderness found. No head of 5th metatarsal and no proximal fibula tenderness found.       Legs:  Skin:    General: Skin is warm and dry.     Findings: No erythema or rash.  Neurological:     Mental Status: She is alert and oriented to person, place, and time.  Sensory: No sensory deficit.  Psychiatric:        Behavior: Behavior normal.      ED Treatments / Results  Labs (all labs ordered are listed, but only abnormal results are displayed) Labs Reviewed  SARS CORONAVIRUS 2 (HOSPITAL ORDER, [place] [facility])    EKG None  Radiology Dg Ankle 2 Views Left  Result Date: 01/12/2019 CLINICAL DATA:  Left ankle pain after fall. EXAM: LEFT ANKLE - 2 VIEW COMPARISON:  None. FINDINGS: Moderately displaced oblique fracture is seen involving the distal left fibula. Mild lateral subluxation of the talus relative to distal tibia is noted. IMPRESSION: Moderately displaced distal left fibular fracture. Mild  lateral subluxation of the talus relative to distal tibia. Electronically Signed   By: Marijo Conception M.D.   On: 01/12/2019 14:20   Dg Knee Complete 4 Views Left  Result Date: 01/12/2019 CLINICAL DATA:  Left knee pain after injury. EXAM: LEFT KNEE - COMPLETE 4+ VIEW COMPARISON:  None. FINDINGS: No evidence of fracture, dislocation, or joint effusion. No evidence of arthropathy or other focal bone abnormality. Soft tissues are unremarkable. IMPRESSION: Negative. Electronically Signed   By: Marijo Conception M.D.   On: 01/12/2019 14:22   Dg Hip Unilat With Pelvis 2-3 Views Left  Result Date: 01/12/2019 CLINICAL DATA:  Left hip pain after fall. EXAM: DG HIP (WITH OR WITHOUT PELVIS) 2-3V LEFT COMPARISON:  None. FINDINGS: There is no evidence of hip fracture or dislocation. There is no evidence of arthropathy or other focal bone abnormality. IMPRESSION: Negative. Electronically Signed   By: Marijo Conception M.D.   On: 01/12/2019 14:23    Procedures Procedures (including critical care time)  Medications Ordered in ED Medications - No data to display   Initial Impression / Assessment and Plan / ED Course  I have reviewed the triage vital signs and the nursing notes.  Pertinent labs & imaging results that were available during my care of the patient were reviewed by me and considered in my medical decision making (see chart for details).  Clinical Course as of Jan 11 1510  Sat Jan 11, 5865  2116 48 year old female with left ankle injury after a fall today.  On exam patient has tenderness the distal fibula and medial malleolus with ligamentous laxity.  Skin is intact, there is swelling and ecchymosis to the ankle.  No pain at the fifth metatarsal or proximal fibula.  Pulse movement sensation intact.  X-ray of the left ankle shows a distal fibula fracture with widened mortise.  Patient was placed in a postop splint with stirrup.  Case discussed with Dr. Sharol Given, on-call with orthopedics request patient be  kept n.p.o. and he will see the patient. Discussed plan of care with patient.  Patient declines any pain medication at this time.   [LM]    Clinical Course User Index [LM] Tacy Learn, PA-C      Final Clinical Impressions(s) / ED Diagnoses   Final diagnoses:  Closed avulsion fracture of distal end of left fibula, initial encounter  Unstable ankle, left    ED Discharge Orders         Ordered    HYDROcodone-acetaminophen (NORCO/VICODIN) 5-325 MG tablet  Every 4 hours PRN     01/12/19 1443           Tacy Learn, PA-C 01/12/19 [employer], [place], DO 01/12/19 1629

## 2019-01-12 NOTE — ED Notes (Signed)
Dr duda here to see

## 2019-01-12 NOTE — Progress Notes (Signed)
Orthopedic Tech Progress Note Patient Details:  Shelby Coleman 1970-10-03 [id number] While I was putting on the splint the PA comes in and says she's waiting on the [facility] DR to call because she believes the patient may need stirrups because her ankle was unstable. So I asked her was that ok to add the stirrups and she said sure. Ortho Devices Type of Ortho Device: Stirrup splint, Post (short) splint, Crutches Splint Material: Fiberglass Ortho Device/Splint Location: LLE Ortho Device/Splint Interventions: Adjustment, Application, Ordered   Post Interventions Patient Tolerated: Well Instructions Provided: Care of device, Adjustment of device   Janit Pagan 01/12/2019, 3:04 PM

## 2019-01-12 NOTE — Progress Notes (Signed)
Late entry:  no belongings with Pt.  Called OR, said that ER did not bring belongings. Called ER, they said will check with security and call back. Notified  Shanon Brow, RN 5N. Via phone. VSS stable, pain controlled. tx via strtcher.  Again, talked to [place] CNA regarding belongings.

## 2019-01-12 NOTE — Transfer of Care (Signed)
Immediate Anesthesia Transfer of Care Note  Patient: Shelby Coleman  Procedure(s) Performed: OPEN REDUCTION INTERNAL FIXATION (ORIF) ANKLE FRACTURE (Left Ankle)  Patient Location: PACU  Anesthesia Type:General  Level of Consciousness: awake and alert   Airway & Oxygen Therapy: Patient Spontanous Breathing  Post-op Assessment: Report given to RN  Post vital signs: Reviewed and stable  Last Vitals:  Vitals Value Taken Time  BP 112/69 01/12/19 1901  Temp 36.5 C 01/12/19 1900  Pulse 80 01/12/19 1909  Resp 15 01/12/19 1909  SpO2 98 % 01/12/19 1909  Vitals shown include unvalidated device data.  Last Pain:  Vitals:   01/12/19 1830  TempSrc:   PainSc: 7          Complications: No apparent anesthesia complications

## 2019-01-12 NOTE — Op Note (Signed)
01/12/2019  6:11 PM  PATIENT:  Shelby Coleman    PRE-OPERATIVE DIAGNOSIS:  ankle fracture left Weber B  POST-OPERATIVE DIAGNOSIS:  Same  PROCEDURE:  OPEN REDUCTION INTERNAL FIXATION (ORIF) ANKLE FRACTURE C-arm fluoroscopy to verify reduction  SURGEON:  Newt Minion, MD  PHYSICIAN ASSISTANT:None ANESTHESIA:   General  PREOPERATIVE INDICATIONS:  Shelby Coleman is a  48 y.o. female with a diagnosis of ankle fracture left who failed conservative measures and elected for surgical management.    The risks benefits and alternatives were discussed with the patient preoperatively including but not limited to the risks of infection, bleeding, nerve injury, cardiopulmonary complications, the need for revision surgery, among others, and the patient was willing to proceed.  OPERATIVE IMPLANTS: 7 hole one third tubular plate  @ENCIMAGES @  OPERATIVE FINDINGS: Congruent mortise postreduction no syndesmotic injury  OPERATIVE PROCEDURE: Patient was brought to the operating room and underwent a general anesthetic.  After adequate levels anesthesia were obtained patient's left lower extremity was prepped using DuraPrep draped into a sterile field a timeout was called.  A lateral incision was made over the fibula this was carried down to bone.  Subperiosteal dissection was used to cleanse the fracture this was then irrigated reduced and a lag screw was placed to stabilize the fracture.  A neutralization plate was then placed laterally locking screws were placed distally x2 compression screws were placed proximally x3.  C arm fluoroscopy verified reduction of the mortise with restoration of length of the fibula.  The wound was irrigated with normal saline subcu was closed using 2-0 Vicryl the skin was closed using staples.   DISCHARGE PLANNING:  Antibiotic duration: 24-hour antibiotics  Weightbearing: Nonweightbearing on the left  Pain medication: Opioid pathway ordered  Dressing care/ Wound VAC:  Change dressing in 1 week  Ambulatory devices: Walker  Discharge to: Discharge to home tomorrow  Follow-up: In the office 1 week post operative.

## 2019-01-12 NOTE — ED Notes (Signed)
Patient transported to X-ray 

## 2019-01-13 DIAGNOSIS — S82892A Other fracture of left lower leg, initial encounter for closed fracture: Secondary | ICD-10-CM | POA: Diagnosis not present

## 2019-01-13 MED ORDER — METHOCARBAMOL 500 MG PO TABS: 500.0000 mg | ORAL_TABLET | Freq: Four times a day (QID) | ORAL | 1 refills | Status: AC | PRN

## 2019-01-13 MED ORDER — ASPIRIN 325 MG PO TBEC: 325.0000 mg | DELAYED_RELEASE_TABLET | Freq: Every day | ORAL | 0 refills | Status: AC

## 2019-01-13 MED ORDER — ACETAMINOPHEN 325 MG PO TABS: 325.0000 mg | ORAL_TABLET | Freq: Four times a day (QID) | ORAL | Status: AC | PRN

## 2019-01-13 NOTE — Evaluation (Signed)
Physical Therapy Evaluation Patient Details Name: Shelby Coleman MRN: [id number] DOB: 08/02/1970 Today's Date: 01/13/2019   History of Present Illness  Shelby Coleman is a 48 y.o. female who presents with a displaced Weber B left ankle fracture of the fibula.  Patient states she had her right leg up on the counter trying to see if she needed to shave and her left ankle gave out on her falling sustaining the fracture.  ORIF left ankle.    Clinical Impression  Pt admitted with above diagnosis. Pt currently with functional limitations due to the deficits listed below (see PT Problem List). Pt was able to ambulate with RW in room short distances with min guard assist.  Difficulty with up and down steps therefore made arrangements for husband to come at 11 am for education.  Pt will benefit from skilled PT to increase their independence and safety with mobility to allow discharge to the venue listed below.      Follow Up Recommendations No PT follow up;Supervision/Assistance - 24 hour    Equipment Recommendations  Rolling walker with 5" wheels;3in1 (PT)    Recommendations for Other Services       Precautions / Restrictions Precautions Precautions: Fall Required Braces or Orthoses: Other Brace(CAM boot left LE) Restrictions Weight Bearing Restrictions: Yes LLE Weight Bearing: Non weight bearing      Mobility  Bed Mobility Overal bed mobility: Independent                Transfers Overall transfer level: Needs assistance Equipment used: Rolling walker (2 wheeled) Transfers: Sit to/from Stand Sit to Stand: Supervision;Min guard         General transfer comment: Pt was able to stand with cues for hand placement.  Pt with good maintainance of weight bearing.   Ambulation/Gait Ambulation/Gait assistance: Min guard;Supervision Gait Distance (Feet): 30 Feet Assistive device: Rolling walker (2 wheeled) Gait Pattern/deviations: Step-to pattern;Decreased stride length   Gait  velocity interpretation: <1.31 ft/sec, indicative of household ambulator General Gait Details: Pt able to ambulate safely with RW in room short distances and maintains NWB left LE.   Stairs Stairs: Yes Stairs assistance: Min assist;+2 safety/equipment Stair Management: Backwards;Step to pattern;Two rails;Forwards;With walker Number of Stairs: 2 General stair comments: Pt tried to go up forwards wtih 2 rails and does not have the strength or stability for more than 1 step.  Pt then instructed in up and down steps backwards with RW and pt did much better needing +2 min guard-min assist.  Pt needed cues for sequencing.    Wheelchair Mobility    Modified Rankin (Stroke Patients Only)       Balance Overall balance assessment: Needs assistance Sitting-balance support: No upper extremity supported;Feet supported Sitting balance-Leahy Scale: Fair     Standing balance support: Bilateral upper extremity supported;During functional activity Standing balance-Leahy Scale: Poor Standing balance comment: relies on UE support for balance in standing.                               Pertinent Vitals/Pain Pain Assessment: No/denies pain    Home Living Family/patient expects to be discharged to:: Private residence Living Arrangements: Spouse/significant other Available Help at Discharge: Family;Available 24 hours/day(husband and daughter) Type of Home: House Home Access: Stairs to enter Entrance Stairs-Rails: Right;Left;Can reach both Entrance Stairs-Number of Steps: 4 Home Layout: One level Home Equipment: Cane - single point(has 2 canes)      Prior Function  Level of Independence: Independent               Hand Dominance        Extremity/Trunk Assessment   Upper Extremity Assessment Upper Extremity Assessment: Defer to OT evaluation    Lower Extremity Assessment Lower Extremity Assessment: LLE deficits/detail LLE: Unable to fully assess due to  immobilization(CAM boot in place)    Cervical / Trunk Assessment Cervical / Trunk Assessment: Normal  Communication   Communication: No difficulties  Cognition Arousal/Alertness: Awake/alert Behavior During Therapy: WFL for tasks assessed/performed Overall Cognitive Status: Within Functional Limits for tasks assessed                                        General Comments General comments (skin integrity, edema, etc.): able to clean herself after using 3N1    Exercises General Exercises - Lower Extremity Heel Slides: AROM;Both;10 reps;Supine   Assessment/Plan    PT Assessment Patient needs continued PT services  PT Problem List Decreased activity tolerance;Decreased balance;Decreased mobility;Decreased knowledge of use of DME;Decreased safety awareness;Decreased knowledge of precautions;Decreased range of motion;Decreased strength       PT Treatment Interventions DME instruction;Gait training;Stair training;Functional mobility training;Therapeutic activities;Therapeutic exercise;Balance training;Patient/family education    PT Goals (Current goals can be found in the Care Plan section)  Acute Rehab PT Goals Patient Stated Goal: to go home PT Goal Formulation: With patient Time For Goal Achievement: 01/27/19 Potential to Achieve Goals: Good    Frequency Min 5X/week   Barriers to discharge        Co-evaluation               AM-PAC PT "6 Clicks" Mobility  Outcome Measure Help needed turning from your back to your side while in a flat bed without using bedrails?: None Help needed moving from lying on your back to sitting on the side of a flat bed without using bedrails?: None Help needed moving to and from a bed to a chair (including a wheelchair)?: A Little Help needed standing up from a chair using your arms (e.g., wheelchair or bedside chair)?: A Little Help needed to walk in hospital room?: A Little Help needed climbing 3-5 steps with a railing?  : A Little 6 Click Score: 20    End of Session Equipment Utilized During Treatment: Gait belt Activity Tolerance: Patient limited by fatigue Patient left: in chair;with call bell/phone within reach Nurse Communication: Mobility status PT Visit Diagnosis: Unsteadiness on feet (R26.81);Muscle weakness (generalized) (M62.81)    Time: 7893-8101 PT Time Calculation (min) (ACUTE ONLY): 40 min   Charges:   PT Evaluation $PT Eval Moderate Complexity: 1 Mod PT Treatments $Gait Training: 8-22 mins $Therapeutic Activity: 8-22 mins        [place] Pager:  [phone]  Office:  [place] 01/13/2019, 11:38 AM

## 2019-01-13 NOTE — Anesthesia Postprocedure Evaluation (Signed)
Anesthesia Post Note  Patient: Shelby Coleman  Procedure(s) Performed: OPEN REDUCTION INTERNAL FIXATION (ORIF) ANKLE FRACTURE (Left Ankle)     Patient location during evaluation: PACU Anesthesia Type: General Level of consciousness: awake and alert Pain management: pain level controlled Vital Signs Assessment: post-procedure vital signs reviewed and stable Respiratory status: spontaneous breathing, nonlabored ventilation, respiratory function stable and patient connected to nasal cannula oxygen Cardiovascular status: blood pressure returned to baseline and stable Postop Assessment: no apparent nausea or vomiting Anesthetic complications: no    Last Vitals:  Vitals:   01/12/19 1930 01/12/19 1941  BP:  132/69  Pulse: 84 84  Resp: 18 16  Temp: 36.5 C 37.4 C  SpO2: 97% 96%    Last Pain:  Vitals:   01/12/19 1941  TempSrc: Oral  PainSc:                  Catalina Gravel

## 2019-01-13 NOTE — Plan of Care (Signed)

## 2019-01-13 NOTE — Progress Notes (Signed)
Orthopedic Tech Progress Note Patient Details:  Shelby Coleman 1970/11/25 [id number]  Ortho Devices Type of Ortho Device: CAM walker Splint Material: Fiberglass Ortho Device/Splint Location: LLE Ortho Device/Splint Interventions: Adjustment, Application, Ordered   Post Interventions Patient Tolerated: Well Instructions Provided: Care of device, Adjustment of device   Shelby Coleman 01/13/2019, 8:20 AM

## 2019-01-13 NOTE — Progress Notes (Signed)
01/13/19 1200  PT Visit Information  Last PT Received On 01/13/19  Assistance Needed +1  History of Present Illness Shelby Coleman is a 48 y.o. female who presents with a displaced Weber B left ankle fracture of the fibula.  Patient states she had her right leg up on the counter trying to see if she needed to shave and her left ankle gave out on her falling sustaining the fracture.  ORIF left ankle.    Subjective Data  Patient Stated Goal to go home  Precautions  Precautions Fall  Required Braces or Orthoses Other Brace (CAM boot left LE)  Restrictions  Weight Bearing Restrictions Yes  LLE Weight Bearing NWB  Pain Assessment  Pain Assessment No/denies pain  Cognition  Arousal/Alertness Awake/alert  Behavior During Therapy WFL for tasks assessed/performed  Overall Cognitive Status Within Functional Limits for tasks assessed  Bed Mobility  General bed mobility comments pt in chair on arrival  Transfers  Overall transfer level Needs assistance  Equipment used Rolling walker (2 wheeled)  Transfers Sit to/from Stand  Sit to Stand Supervision;Min guard  General transfer comment Pt was able to stand with cues for hand placement.  Pt with good maintainance of weight bearing.   Ambulation/Gait  Ambulation/Gait assistance Min guard;Supervision  Gait Distance (Feet) 20 Feet  Assistive device Rolling walker (2 wheeled)  Gait Pattern/deviations Step-to pattern;Decreased stride length  General Gait Details Pt able to ambulate safely with RW in room short distances and maintains NWB left LE.  Showed husband how to guard pt as well as how to use gait belt.   Gait velocity interpretation <1.31 ft/sec, indicative of household ambulator  Stairs Yes  Stairs assistance Min assist;+2 safety/equipment  Stair Management Backwards;Step to pattern;Two rails;Forwards;With walker  Number of Stairs 2  General stair comments  Pt and husband instructed in up and down steps backwards with RW and pt did  much better needing +2 min guard assist.  Pt needed cues for sequencing.  Pt and husband feel comfortable   Balance  Overall balance assessment Needs assistance  Sitting-balance support No upper extremity supported;Feet supported  Sitting balance-Leahy Scale Fair  Standing balance support Bilateral upper extremity supported;During functional activity  Standing balance-Leahy Scale Poor  Standing balance comment relies on UE support for balance in standing.    Exercises  Exercises General Lower Extremity  General Exercises - Lower Extremity  Heel Slides AROM;Both;10 reps;Supine  PT - End of Session  Equipment Utilized During Treatment Gait belt  Activity Tolerance Patient tolerated treatment well  Patient left in chair;with call bell/phone within reach;with family/visitor present  Nurse Communication Mobility status   PT - Assessment/Plan  PT Plan Current plan remains appropriate  PT Visit Diagnosis Unsteadiness on feet (R26.81);Muscle weakness (generalized) (M62.81)  PT Frequency (ACUTE ONLY) Min 5X/week  Follow Up Recommendations No PT follow up;Supervision/Assistance - 24 hour  PT equipment Rolling walker with 5" wheels;3in1 (PT)  AM-PAC PT "6 Clicks" Mobility Outcome Measure (Version 2)  Help needed turning from your back to your side while in a flat bed without using bedrails? 4  Help needed moving from lying on your back to sitting on the side of a flat bed without using bedrails? 4  Help needed moving to and from a bed to a chair (including a wheelchair)? 3  Help needed standing up from a chair using your arms (e.g., wheelchair or bedside chair)? 3  Help needed to walk in hospital room? 3  Help needed climbing 3-5  steps with a railing?  3  6 Click Score 20  Consider Recommendation of Discharge To: Home with no services  PT Goal Progression  Progress towards PT goals Progressing toward goals  PT Time Calculation  PT Start Time (ACUTE ONLY) 1100  PT Stop Time (ACUTE ONLY) 1124   PT Time Calculation (min) (ACUTE ONLY) 24 min  PT General Charges  $$ ACUTE PT VISIT 1 Visit  PT Treatments  $Gait Training 23-37 mins  Pt was able to complete education with PT with husband present.  Ready to go home and husband is able to return demonstration of mobility assist.   [place] Pager:  [phone]  Office:  [phone]

## 2019-01-13 NOTE — Discharge Instructions (Signed)
Non weight bearing on left leg with crutches or walker.  May use ice packs to ankle  Elevate the left foot as much as possible. Follow up in the office in 1 week

## 2019-01-13 NOTE — Progress Notes (Signed)
Subjective: 1 Day Post-Op Procedure(s) (LRB): OPEN REDUCTION INTERNAL FIXATION (ORIF) ANKLE FRACTURE (Left) Patient reports pain as mild and moderate.    Objective: Vital signs in last 24 hours: Temp:  [97.7 F (36.5 C)-99.3 F (37.4 C)] 98.5 F (36.9 C) (07/05 0556) Pulse Rate:  [78-94] 82 (07/05 0556) Resp:  [12-20] 14 (07/05 0556) BP: (104-138)/(65-77) 126/77 (07/05 0556) SpO2:  [95 %-98 %] 97 % (07/05 0556) Weight:  [90.7 kg] 90.7 kg (07/04 1325)  Intake/Output from previous day: No intake/output data recorded. Intake/Output this shift: No intake/output data recorded.  Recent Labs    01/12/19 1537  HGB 13.7   Recent Labs    01/12/19 1537  WBC 16.3*  RBC 4.58  HCT 39.5  PLT 318   Recent Labs    01/12/19 1537  NA 137  K 3.9  CL 106  CO2 21*  BUN 11  CREATININE 0.96  GLUCOSE 103*  CALCIUM 9.3   No results for input(s): LABPT, INR in the last 72 hours.  Left ankle with dressings in place without drainage. Neurovascular intact distally.    Assessment/Plan: 1 Day Post-Op Procedure(s) (LRB): OPEN REDUCTION INTERNAL FIXATION (ORIF) ANKLE FRACTURE (Left) Up with PT non weight bearing on the left leg with crutches or walker.  Follow up in the office in 1 week.    Erlinda Hong, PA-C 01/13/2019, 7:33 AM  [place]

## 2019-01-13 NOTE — TOC Transition Note (Signed)
Transition of Care [facility]) - CM/SW Discharge Note   Patient Details  Name: MASHAL SLAVICK MRN: [id number] Date of Birth: Aug 06, 1970  Transition of Care [facility] CM/SW Contact:  Claudie Leach, RN Phone Number: 01/13/2019, 11:23 AM   Clinical Narrative:    Pt to dc home with husband.  Patient declines home health.   Final next level of care: Home/Self Care Barriers to Discharge: No Barriers Identified                Discharge Plan and Services                DME Arranged: 3-N-1, Walker rolling DME Agency: [employer] Date DME Agency Contacted: 01/13/19 Time DME Agency Contacted: 0923 Representative spoke with at DME Agency: Bertrum Sol

## 2019-01-13 NOTE — Progress Notes (Signed)
Shelby Coleman to be D/C'd home per MD order.  Discussed prescriptions and follow up appointments with the patient. Prescriptions given to patient, medication list explained in detail. Pt verbalized understanding.  Allergies as of 01/13/2019   No Known Allergies     Medication List    TAKE these medications   acetaminophen 325 MG tablet Commonly known as: TYLENOL Take 1-2 tablets (325-650 mg total) by mouth every 6 (six) hours as needed for mild pain (pain score 1-3 or temp > 100.5).   amLODipine 10 MG tablet Commonly known as: NORVASC Take 10 mg by mouth daily.   aspirin 325 MG EC tablet Take 1 tablet (325 mg total) by mouth daily.   atorvastatin 20 MG tablet Commonly known as: LIPITOR Take 20 mg by mouth daily.   escitalopram 20 MG tablet Commonly known as: LEXAPRO Take 20 mg by mouth daily.   HYDROcodone-acetaminophen 5-325 MG tablet Commonly known as: NORCO/VICODIN Take 1 tablet by mouth every 4 (four) hours as needed.   ibuprofen 200 MG tablet Commonly known as: ADVIL Take 400 mg by mouth every 6 (six) hours as needed (for pain).   losartan 100 MG tablet Commonly known as: COZAAR Take 100 mg by mouth daily.   meloxicam 15 MG tablet Commonly known as: MOBIC Take 15 mg by mouth daily.   methocarbamol 500 MG tablet Commonly known as: ROBAXIN Take 1 tablet (500 mg total) by mouth every 6 (six) hours as needed for muscle spasms.            Durable Medical Equipment  (From admission, onward)         Start     Ordered   01/13/19 1121  For home use only DME 3 n 1  Once     01/13/19 1120   01/13/19 1120  For home use only DME Walker rolling  Once    Question:  Patient needs a walker to treat with the following condition  Answer:  Ankle fracture   01/13/19 1120          Vitals:   01/13/19 0556 01/13/19 0900  BP: 126/77 137/77  Pulse: 82 79  Resp: 14 16  Temp: 98.5 F (36.9 C) 98.4 F (36.9 C)  SpO2: 97% 97%    Skin clean, dry and intact without  evidence of skin break down, no evidence of skin tears noted. IV catheter discontinued intact. Site without signs and symptoms of complications. Dressing and pressure applied. Pt denies pain at this time. No complaints noted.  An After Visit Summary was printed and given to the patient. Patient escorted via [place], and D/C home via private auto.  Maryan Rued RN [place]

## 2019-01-13 NOTE — Discharge Summary (Signed)
Discharge Diagnoses:  Active Problems:   Closed fracture of left ankle   Ankle fracture, lateral malleolus, closed   Surgeries: Procedure(s): OPEN REDUCTION INTERNAL FIXATION (ORIF) ANKLE FRACTURE on 01/12/2019    Consultants:   Discharged Condition: Improved  Hospital Course: Shelby Coleman is an 48 y.o. female who was admitted 01/12/2019 with a chief complaint of left ankle fracture, with a final diagnosis of ankle fracture left.  Patient was brought to the operating room on 01/12/2019 and underwent Procedure(s): OPEN REDUCTION INTERNAL FIXATION (ORIF) ANKLE FRACTURE.    Patient was given perioperative antibiotics:  Anti-infectives (From admission, onward)   Start     Dose/Rate Route Frequency Ordered Stop   01/13/19 0000  ceFAZolin (ANCEF) IVPB 1 g/50 mL premix     1 g 100 mL/hr over 30 Minutes Intravenous Every 6 hours 01/12/19 2007 01/13/19 1759   01/12/19 2230  ceFAZolin (ANCEF) IVPB 2g/100 mL premix     2 g 200 mL/hr over 30 Minutes Intravenous On call to O.R. 01/12/19 2144 01/13/19 0559   01/12/19 1720  ceFAZolin (ANCEF) 2-4 GM/100ML-% IVPB    Note to Pharmacy: Orvis Brill  : cabinet override      01/12/19 1720 01/13/19 0529    .  Patient was given sequential compression devices, early ambulation, and aspirin for DVT prophylaxis.  Recent vital signs:  Patient Vitals for the past 24 hrs:  BP Temp Temp src Pulse Resp SpO2 Height Weight  01/13/19 0556 126/77 98.5 F (36.9 C) Oral 82 14 97 % - -  01/13/19 0047 138/75 98.7 F (37.1 C) Oral 93 14 95 % - -  01/12/19 1941 132/69 99.3 F (37.4 C) Oral 84 16 96 % - -  01/12/19 1930 - 97.7 F (36.5 C) - 84 18 97 % - -  01/12/19 1915 111/66 - - 78 15 97 % - -  01/12/19 1900 112/69 97.7 F (36.5 C) - 80 17 98 % - -  01/12/19 1845 109/71 - - 81 12 97 % - -  01/12/19 1830 104/65 - - 80 20 97 % - -  01/12/19 1815 126/69 98.6 F (37 C) - 94 12 98 % - -  01/12/19 1645 116/69 - - 80 - 96 % - -  01/12/19 1615 125/74 - - 86  - 96 % - -  01/12/19 1545 125/70 - - 78 - 97 % - -  01/12/19 1533 129/76 98.4 F (36.9 C) - 83 18 98 % - -  01/12/19 1328 124/77 98.4 F (36.9 C) Oral 81 16 97 % - -  01/12/19 1325 - - - - - - 5\' 3"  (1.6 m) 90.7 kg  .  Recent laboratory studies: Dg Ankle 2 Views Left  Result Date: 01/12/2019 CLINICAL DATA:  Left ankle pain after fall. EXAM: LEFT ANKLE - 2 VIEW COMPARISON:  None. FINDINGS: Moderately displaced oblique fracture is seen involving the distal left fibula. Mild lateral subluxation of the talus relative to distal tibia is noted. IMPRESSION: Moderately displaced distal left fibular fracture. Mild lateral subluxation of the talus relative to distal tibia. Electronically Signed   By: Marijo Conception M.D.   On: 01/12/2019 14:20   Dg Knee Complete 4 Views Left  Result Date: 01/12/2019 CLINICAL DATA:  Left knee pain after injury. EXAM: LEFT KNEE - COMPLETE 4+ VIEW COMPARISON:  None. FINDINGS: No evidence of fracture, dislocation, or joint effusion. No evidence of arthropathy or other focal bone abnormality. Soft tissues are unremarkable. IMPRESSION: Negative.  Electronically Signed   By: Lupita RaiderJames  Green Jr M.D.   On: 01/12/2019 14:22   Dg Hip Unilat With Pelvis 2-3 Views Left  Result Date: 01/12/2019 CLINICAL DATA:  Left hip pain after fall. EXAM: DG HIP (WITH OR WITHOUT PELVIS) 2-3V LEFT COMPARISON:  None. FINDINGS: There is no evidence of hip fracture or dislocation. There is no evidence of arthropathy or other focal bone abnormality. IMPRESSION: Negative. Electronically Signed   By: Lupita RaiderJames  Green Jr M.D.   On: 01/12/2019 14:23    Discharge Medications:   Allergies as of 01/13/2019   No Known Allergies     Medication List    TAKE these medications   acetaminophen 325 MG tablet Commonly known as: TYLENOL Take 1-2 tablets (325-650 mg total) by mouth every 6 (six) hours as needed for mild pain (pain score 1-3 or temp > 100.5).   amLODipine 10 MG tablet Commonly known as: NORVASC Take 10  mg by mouth daily.   aspirin 325 MG EC tablet Take 1 tablet (325 mg total) by mouth daily.   atorvastatin 20 MG tablet Commonly known as: LIPITOR Take 20 mg by mouth daily.   escitalopram 20 MG tablet Commonly known as: LEXAPRO Take 20 mg by mouth daily.   HYDROcodone-acetaminophen 5-325 MG tablet Commonly known as: NORCO/VICODIN Take 1 tablet by mouth every 4 (four) hours as needed.   ibuprofen 200 MG tablet Commonly known as: ADVIL Take 400 mg by mouth every 6 (six) hours as needed (for pain).   losartan 100 MG tablet Commonly known as: COZAAR Take 100 mg by mouth daily.   meloxicam 15 MG tablet Commonly known as: MOBIC Take 15 mg by mouth daily.   methocarbamol 500 MG tablet Commonly known as: ROBAXIN Take 1 tablet (500 mg total) by mouth every 6 (six) hours as needed for muscle spasms.       Diagnostic Studies: Dg Ankle 2 Views Left  Result Date: 01/12/2019 CLINICAL DATA:  Left ankle pain after fall. EXAM: LEFT ANKLE - 2 VIEW COMPARISON:  None. FINDINGS: Moderately displaced oblique fracture is seen involving the distal left fibula. Mild lateral subluxation of the talus relative to distal tibia is noted. IMPRESSION: Moderately displaced distal left fibular fracture. Mild lateral subluxation of the talus relative to distal tibia. Electronically Signed   By: Lupita RaiderJames  Green Jr M.D.   On: 01/12/2019 14:20   Dg Knee Complete 4 Views Left  Result Date: 01/12/2019 CLINICAL DATA:  Left knee pain after injury. EXAM: LEFT KNEE - COMPLETE 4+ VIEW COMPARISON:  None. FINDINGS: No evidence of fracture, dislocation, or joint effusion. No evidence of arthropathy or other focal bone abnormality. Soft tissues are unremarkable. IMPRESSION: Negative. Electronically Signed   By: Lupita RaiderJames  Green Jr M.D.   On: 01/12/2019 14:22   Dg Hip Unilat With Pelvis 2-3 Views Left  Result Date: 01/12/2019 CLINICAL DATA:  Left hip pain after fall. EXAM: DG HIP (WITH OR WITHOUT PELVIS) 2-3V LEFT COMPARISON:   None. FINDINGS: There is no evidence of hip fracture or dislocation. There is no evidence of arthropathy or other focal bone abnormality. IMPRESSION: Negative. Electronically Signed   By: Lupita RaiderJames  Green Jr M.D.   On: 01/12/2019 14:23    Patient benefited maximally from their hospital stay and there were no complications.     Disposition: Discharge disposition: 01-Home or Self Care      Discharge Instructions    Call MD / Call 911   Complete by: As directed    If  you experience chest pain or shortness of breath, CALL 911 and be transported to the hospital emergency room.  If you develope a fever above 101 F, pus (white drainage) or increased drainage or redness at the wound, or calf pain, call your surgeon's office.   Constipation Prevention   Complete by: As directed    Drink plenty of fluids.  Prune juice may be helpful.  You may use a stool softener, such as Colace (over the counter) 100 mg twice a day.  Use MiraLax (over the counter) for constipation as needed.   Diet - low sodium heart healthy   Complete by: As directed    Discharge instructions   Complete by: As directed    Non weight bearing on left leg with crutches or walker.  May use ice packs to ankle  Elevate the left foot as much as possible. Follow up in the office in 1 week   Increase activity slowly as tolerated   Complete by: As directed      Follow-up Information    Nadara Mustarduda, Marcus V, MD In 1 week.   Specialty: Orthopedic Surgery Contact information: [address] [place] [address] [phone]            Signed: Franki MonteSHAWN Colt Martelle, PA-C 01/13/2019, 7:40 AM  [facility] [phone]

## 2019-01-16 ENCOUNTER — Encounter (HOSPITAL_COMMUNITY): Payer: Self-pay | Admitting: Orthopedic Surgery

## 2019-01-22 ENCOUNTER — Other Ambulatory Visit: Payer: Self-pay

## 2019-01-22 ENCOUNTER — Ambulatory Visit (INDEPENDENT_AMBULATORY_CARE_PROVIDER_SITE_OTHER): Payer: BC Managed Care – PPO

## 2019-01-22 ENCOUNTER — Encounter: Payer: Self-pay | Admitting: Family

## 2019-01-22 ENCOUNTER — Ambulatory Visit (INDEPENDENT_AMBULATORY_CARE_PROVIDER_SITE_OTHER): Payer: BC Managed Care – PPO | Admitting: Family

## 2019-01-22 DIAGNOSIS — S82892A Other fracture of left lower leg, initial encounter for closed fracture: Secondary | ICD-10-CM

## 2019-01-22 NOTE — Progress Notes (Signed)
   Post-Op Visit Note   Patient: Shelby Coleman           Date of Birth: [birth year]           MRN: [id number] Visit Date: 01/22/2019 PCP: Sharilyn Sites, MD  Chief Complaint:  Chief Complaint  Patient presents with  . Left Ankle - Routine Post Op    HPI:  HPI The patient is a 48 year old woman seen today status post left ankle ORIF on July 4.  She states her pain is manageable she has had a little swelling wondering how long she will need to continue out of work. Ortho Exam On examination incision well approximated with staples there is no drainage no surrounding erythema minimal swelling no sign of infection.  Visit Diagnoses:  1. Closed fracture of left ankle, initial encounter     Plan: Begin daily Dial soap cleansing.  She may shower.  Continue nonweightbearing.  Continue with cam walker.  We will fill out her return to work paperwork and fax in today.  Repeat radiographs at follow-up.  Follow-Up Instructions: Return in about 2 weeks (around 02/05/2019).   Imaging: Xr Ankle 2 Views Left  Result Date: 01/22/2019 Radiographs left ankle show stable alignment of fixation hardware. No complicating features.    Orders:  Orders Placed This Encounter  Procedures  . XR Ankle 2 Views Left   No orders of the defined types were placed in this encounter.    PMFS History: Patient Active Problem List   Diagnosis Date Noted  . Ankle fracture, lateral malleolus, closed 01/12/2019  . Closed fracture of left ankle    Past Medical History:  Diagnosis Date  . Arthritis   . Hyperlipemia   . Hypertension     Family History  Problem Relation Age of Onset  . Breast cancer Maternal Grandmother     Past Surgical History:  Procedure Laterality Date  . ORIF ANKLE FRACTURE Left 01/12/2019   Procedure: OPEN REDUCTION INTERNAL FIXATION (ORIF) ANKLE FRACTURE;  Surgeon: Newt Minion, MD;  Location: [facility];  Service: Orthopedics;  Laterality: Left;   Social History   Occupational  History  . Not on file  Tobacco Use  . Smoking status: Never Smoker  . Smokeless tobacco: Never Used  Substance and Sexual Activity  . Alcohol use: Not Currently  . Drug use: Never  . Sexual activity: Not on file

## 2019-02-04 ENCOUNTER — Telehealth: Payer: Self-pay | Admitting: Family

## 2019-02-04 NOTE — Telephone Encounter (Signed)
Patient called asked for a call back concerning the S/T disability papers she left for Erin to have completed and fax back. The number to contact patient is [phone]

## 2019-02-05 ENCOUNTER — Ambulatory Visit (INDEPENDENT_AMBULATORY_CARE_PROVIDER_SITE_OTHER): Payer: BC Managed Care – PPO

## 2019-02-05 ENCOUNTER — Encounter: Payer: Self-pay | Admitting: Family

## 2019-02-05 ENCOUNTER — Ambulatory Visit (INDEPENDENT_AMBULATORY_CARE_PROVIDER_SITE_OTHER): Payer: BC Managed Care – PPO | Admitting: Family

## 2019-02-05 DIAGNOSIS — S82892A Other fracture of left lower leg, initial encounter for closed fracture: Secondary | ICD-10-CM

## 2019-02-05 NOTE — Progress Notes (Signed)
   Post-Op Visit Note   Patient: Shelby Coleman           Date of Birth: May 08, 1971           MRN: [id number] Visit Date: 02/05/2019 PCP: Sharilyn Sites, MD  Chief Complaint:  No chief complaint on file.   HPI:  HPI The patient is a 48 year old woman seen today status post left ankle ORIF on July 4.  Continues in the cam walker nonweightbearing has been doing well.  Minimal swelling.  Does continue to have staples.  Ortho Exam On examination incision well healed, there is no drainage no erythema minimal swelling.  Visit Diagnoses:  1. Closed fracture of left ankle, initial encounter     Plan: Harvest staples.  She may shower may get the incision wet.  She will begin touchdown weightbearing in her cam boot.  Paperwork faxed into her employer today in office.  Given a hard copy.  She will follow-up once more in 2 weeks with Dr. Sharol Given Follow-Up Instructions: No follow-ups on file.   Imaging: No results found.  Orders:  Orders Placed This Encounter  Procedures  . XR Ankle Complete Left   No orders of the defined types were placed in this encounter.    PMFS History: Patient Active Problem List   Diagnosis Date Noted  . Ankle fracture, lateral malleolus, closed 01/12/2019  . Closed fracture of left ankle    Past Medical History:  Diagnosis Date  . Arthritis   . Hyperlipemia   . Hypertension     Family History  Problem Relation Age of Onset  . Breast cancer Maternal Grandmother     Past Surgical History:  Procedure Laterality Date  . ORIF ANKLE FRACTURE Left 01/12/2019   Procedure: OPEN REDUCTION INTERNAL FIXATION (ORIF) ANKLE FRACTURE;  Surgeon: Newt Minion, MD;  Location: [facility];  Service: Orthopedics;  Laterality: Left;   Social History   Occupational History  . Not on file  Tobacco Use  . Smoking status: Never Smoker  . Smokeless tobacco: Never Used  Substance and Sexual Activity  . Alcohol use: Not Currently  . Drug use: Never  . Sexual activity: Not on  file

## 2019-02-05 NOTE — Telephone Encounter (Signed)
Pt was in office today and Erin refaxed paperwork today.

## 2019-02-21 ENCOUNTER — Ambulatory Visit (INDEPENDENT_AMBULATORY_CARE_PROVIDER_SITE_OTHER): Payer: BC Managed Care – PPO | Admitting: Orthopedic Surgery

## 2019-02-21 ENCOUNTER — Encounter: Payer: Self-pay | Admitting: Orthopedic Surgery

## 2019-02-21 VITALS — Ht 63.0 in | Wt 200.0 lb

## 2019-02-21 DIAGNOSIS — S82892A Other fracture of left lower leg, initial encounter for closed fracture: Secondary | ICD-10-CM

## 2019-02-22 DIAGNOSIS — F419 Anxiety disorder, unspecified: Secondary | ICD-10-CM | POA: Diagnosis not present

## 2019-02-22 DIAGNOSIS — I10 Essential (primary) hypertension: Secondary | ICD-10-CM | POA: Diagnosis not present

## 2019-02-27 ENCOUNTER — Encounter: Payer: Self-pay | Admitting: Orthopedic Surgery

## 2019-02-27 NOTE — Progress Notes (Signed)
   Office Visit Note   Patient: Shelby Coleman           Date of Birth: 09/17/1970           MRN: [id number] Visit Date: 02/21/2019              Requested by: Sharilyn Sites, [address] [place],  [place] [address] PCP: Sharilyn Sites, MD  Chief Complaint  Patient presents with  . Left Ankle - Routine Post Op    01/12/2019 left ankle ORIF      HPI: Patient is a 48 year old woman who presents 5 weeks status post open reduction internal fixation left ankle fracture.  She is currently ambulating in a walker with a fracture boot she has no concerns.  Assessment & Plan: Visit Diagnoses:  1. Closed fracture of left ankle, initial encounter     Plan: We will plan to follow-up in 4 weeks with repeat radiographs at that time anticipate she can return to work.  She is given instructions to work on ankle dorsiflexion.  Follow-Up Instructions: Return in about 4 weeks (around 03/21/2019).   Ortho Exam  Patient is alert, oriented, no adenopathy, well-dressed, normal affect, normal respiratory effort. Examination the incision is well-healed she has good range of motion of the ankle there is no redness no cellulitis no signs of infection no signs of DVT.  Imaging: No results found. No images are attached to the encounter.  Labs: No results found for: HGBA1C, ESRSEDRATE, CRP, LABURIC, REPTSTATUS, GRAMSTAIN, CULT, LABORGA   No results found for: ALBUMIN, PREALBUMIN, LABURIC  No results found for: MG No results found for: VD25OH  No results found for: PREALBUMIN CBC EXTENDED Latest Ref Rng & Units 01/12/2019  WBC 4.0 - 10.5 K/uL 16.3(H)  RBC 3.87 - 5.11 MIL/uL 4.58  HGB 12.0 - 15.0 g/dL 13.7  HCT 36.0 - 46.0 % 39.5  PLT 150 - 400 K/uL 318     Body mass index is 35.43 kg/m.  Orders:  No orders of the defined types were placed in this encounter.  No orders of the defined types were placed in this encounter.    Procedures: No procedures performed  Clinical Data: No  additional findings.  ROS:  All other systems negative, except as noted in the HPI. Review of Systems  Objective: Vital Signs: Ht 5\' 3"  (1.6 m)   Wt 200 lb (90.7 kg)   BMI 35.43 kg/m   Specialty Comments:  No specialty comments available.  PMFS History: Patient Active Problem List   Diagnosis Date Noted  . Ankle fracture, lateral malleolus, closed 01/12/2019  . Closed fracture of left ankle    Past Medical History:  Diagnosis Date  . Arthritis   . Hyperlipemia   . Hypertension     Family History  Problem Relation Age of Onset  . Breast cancer Maternal Grandmother     Past Surgical History:  Procedure Laterality Date  . ORIF ANKLE FRACTURE Left 01/12/2019   Procedure: OPEN REDUCTION INTERNAL FIXATION (ORIF) ANKLE FRACTURE;  Surgeon: Newt Minion, MD;  Location: [facility];  Service: Orthopedics;  Laterality: Left;   Social History   Occupational History  . Not on file  Tobacco Use  . Smoking status: Never Smoker  . Smokeless tobacco: Never Used  Substance and Sexual Activity  . Alcohol use: Not Currently  . Drug use: Never  . Sexual activity: Not on file

## 2019-03-21 ENCOUNTER — Ambulatory Visit: Payer: BC Managed Care – PPO | Admitting: Orthopedic Surgery

## 2019-03-26 ENCOUNTER — Ambulatory Visit: Payer: Self-pay

## 2019-03-26 ENCOUNTER — Ambulatory Visit (INDEPENDENT_AMBULATORY_CARE_PROVIDER_SITE_OTHER): Payer: BC Managed Care – PPO | Admitting: Orthopedic Surgery

## 2019-03-26 ENCOUNTER — Encounter: Payer: Self-pay | Admitting: Orthopedic Surgery

## 2019-03-26 VITALS — Ht 63.0 in | Wt 200.0 lb

## 2019-03-26 DIAGNOSIS — S82892D Other fracture of left lower leg, subsequent encounter for closed fracture with routine healing: Secondary | ICD-10-CM

## 2019-03-26 DIAGNOSIS — M25562 Pain in left knee: Secondary | ICD-10-CM

## 2019-03-26 NOTE — Progress Notes (Signed)
Office Visit Note   Patient: Shelby Coleman           Date of Birth: 05/31/1971           MRN: [id number] Visit Date: 03/26/2019              Requested by: Assunta FoundGolding, John, MD [address] [place],  [place] [id number] PCP: Assunta FoundGolding, John, MD  Chief Complaint  Patient presents with  . Left Ankle - Routine Post Op    01/12/2019 ORIF left ankle fx      HPI: Patient is a 48 year old woman who presents status post open reduction internal fixation left ankle fracture.  She is currently full weightbearing in regular shoewear.  She has some aching pain with prolonged ambulation.  She recently went shopping with her daughter and states she had to rest due to ankle pain.  She is worried about returning to work in a few weeks.  She has also begun having some left knee pain.  This began a few weeks ago.  Complaining of buckling of her knee especially when descending stairs.  Denies pain.  Assessment & Plan: Visit Diagnoses:  1. Closed fracture of left ankle with routine healing, subsequent encounter   2. Acute pain of left knee     Plan: She will continue to advance her weightbearing as tolerated offered an ASO for her did discuss her knee pain.  She will begin with taking 2 Aleve twice a day for the next 2 weeks if this does not resolve discussed option of steroid injection.  She declined at this time.  Follow-Up Instructions: Return in about 3 weeks (around 04/16/2019), or if symptoms worsen or fail to improve.   Left Knee Exam   Muscle Strength  The patient has normal left knee strength.  Tenderness  The patient is experiencing tenderness in the lateral joint line and medial joint line.  Range of Motion  The patient has normal left knee ROM.  Tests  Varus: negative Valgus: negative  Other  Erythema: absent Swelling: none Effusion: no effusion present      Patient is alert, oriented, no adenopathy, well-dressed, normal affect, normal respiratory effort. Examination the  incision is well-healed she has good range of motion of the ankle there is no redness no cellulitis no signs of infection no signs of DVT.  Imaging: No results found. No images are attached to the encounter.  Labs: No results found for: HGBA1C, ESRSEDRATE, CRP, LABURIC, REPTSTATUS, GRAMSTAIN, CULT, LABORGA   No results found for: ALBUMIN, PREALBUMIN, LABURIC  No results found for: MG No results found for: VD25OH  No results found for: PREALBUMIN CBC EXTENDED Latest Ref Rng & Units 01/12/2019  WBC 4.0 - 10.5 K/uL 16.3(H)  RBC 3.87 - 5.11 MIL/uL 4.58  HGB 12.0 - 15.0 g/dL 19.113.7  HCT 47.836.0 - 29.546.0 % 39.5  PLT 150 - 400 K/uL 318     Body mass index is 35.43 kg/m.  Orders:  Orders Placed This Encounter  Procedures  . XR Ankle Complete Left  . XR Knee 1-2 Views Left   No orders of the defined types were placed in this encounter.    Procedures: No procedures performed  Clinical Data: No additional findings.  ROS:  All other systems negative, except as noted in the HPI. Review of Systems  Constitutional: Negative for chills and fever.  Musculoskeletal: Positive for arthralgias and joint swelling.  Neurological: Negative for weakness.    Objective: Vital Signs: Ht 5\' 3"  (  1.6 m)   Wt 200 lb (90.7 kg)   BMI 35.43 kg/m   Specialty Comments:  No specialty comments available.  PMFS History: Patient Active Problem List   Diagnosis Date Noted  . Ankle fracture, lateral malleolus, closed 01/12/2019  . Closed fracture of left ankle    Past Medical History:  Diagnosis Date  . Arthritis   . Hyperlipemia   . Hypertension     Family History  Problem Relation Age of Onset  . Breast cancer Maternal Grandmother     Past Surgical History:  Procedure Laterality Date  . ORIF ANKLE FRACTURE Left 01/12/2019   Procedure: OPEN REDUCTION INTERNAL FIXATION (ORIF) ANKLE FRACTURE;  Surgeon: Newt Minion, MD;  Location: [facility];  Service: Orthopedics;  Laterality: Left;   Social  History   Occupational History  . Not on file  Tobacco Use  . Smoking status: Never Smoker  . Smokeless tobacco: Never Used  Substance and Sexual Activity  . Alcohol use: Not Currently  . Drug use: Never  . Sexual activity: Not on file

## 2019-09-26 IMAGING — DX LEFT KNEE - COMPLETE 4+ VIEW
4 series · 4 of 4 positions shown · non-contrast
Comparison: None.

CLINICAL DATA: Left knee pain after injury.

EXAM:
LEFT KNEE - COMPLETE 4+ VIEW

[knee ap]
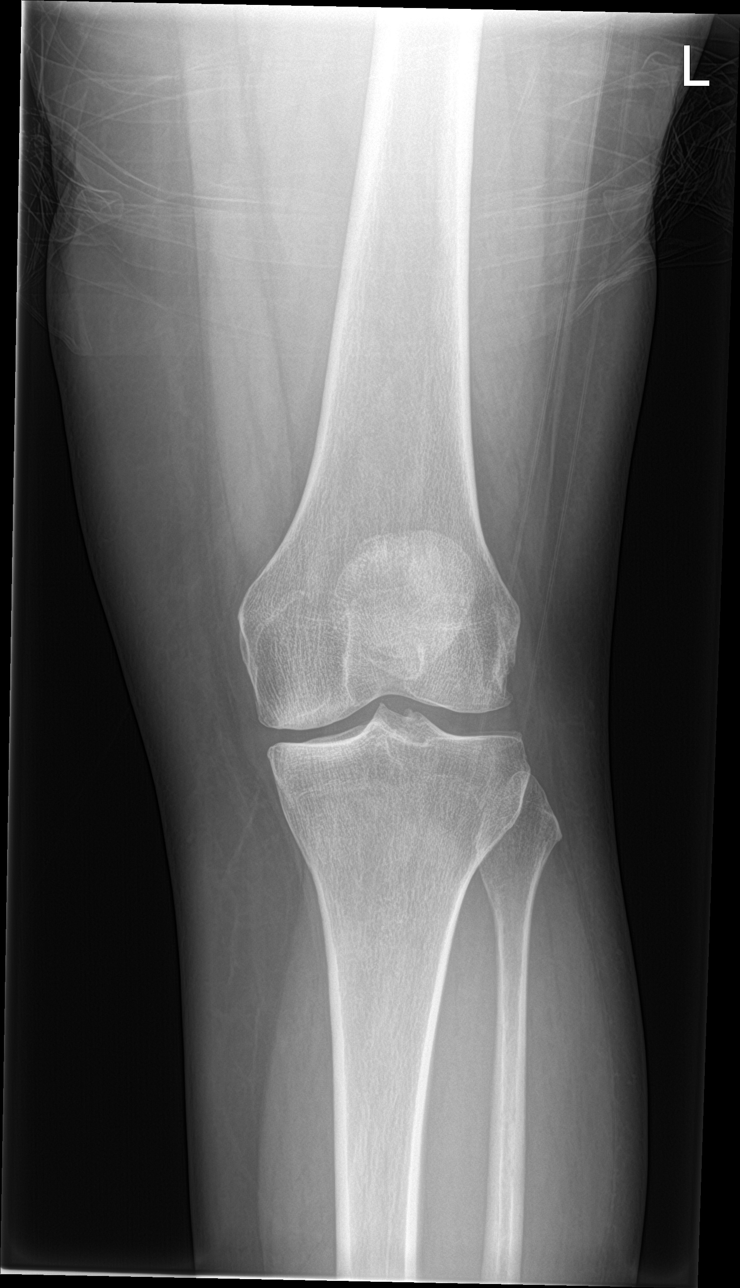

[knee lat]
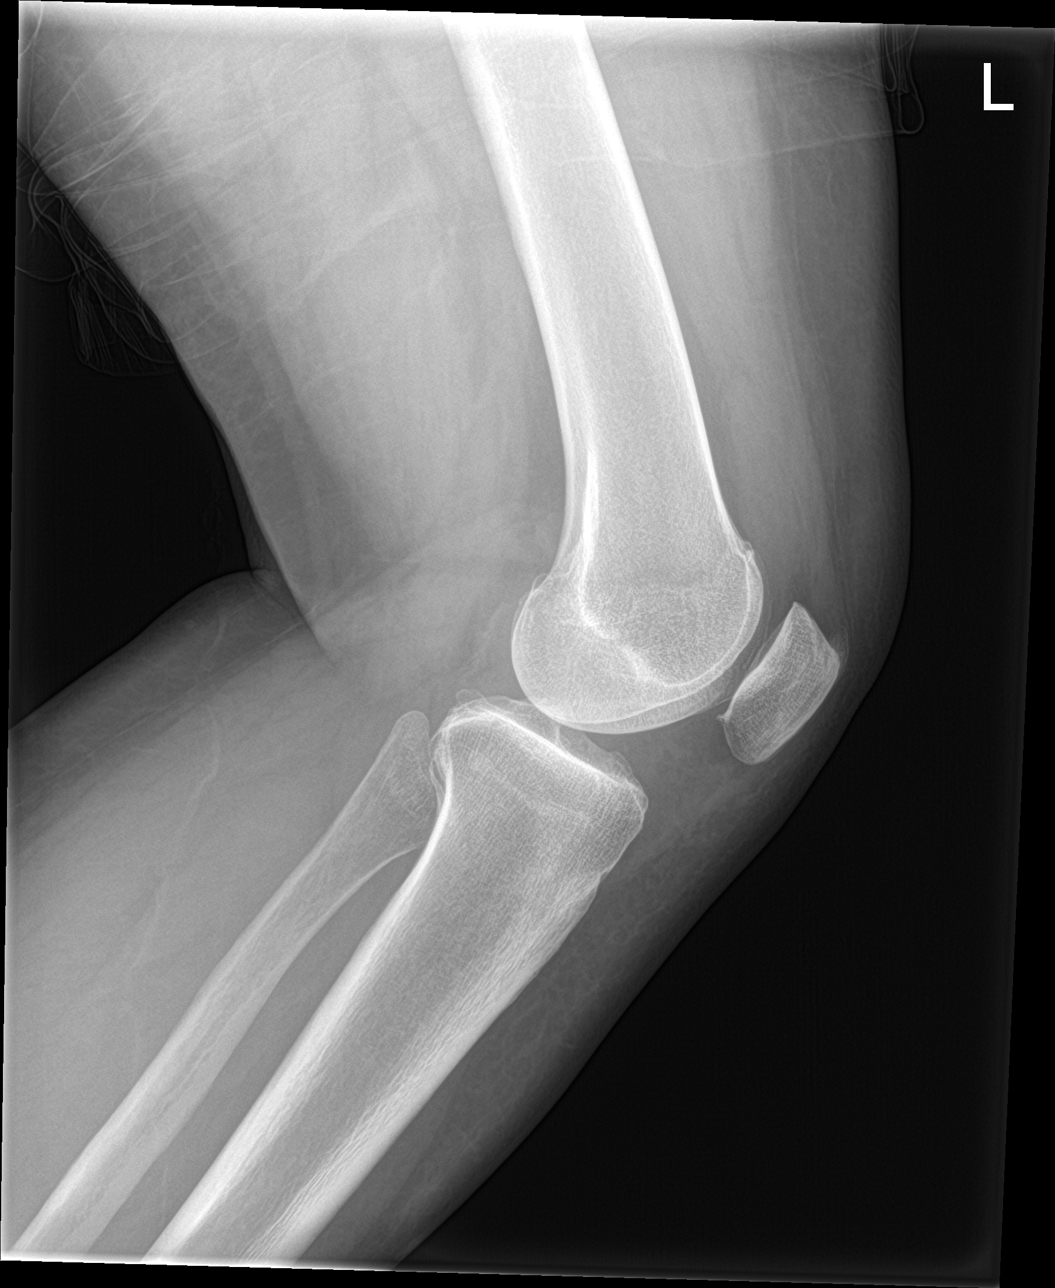

[knee obl (1 of 2)]
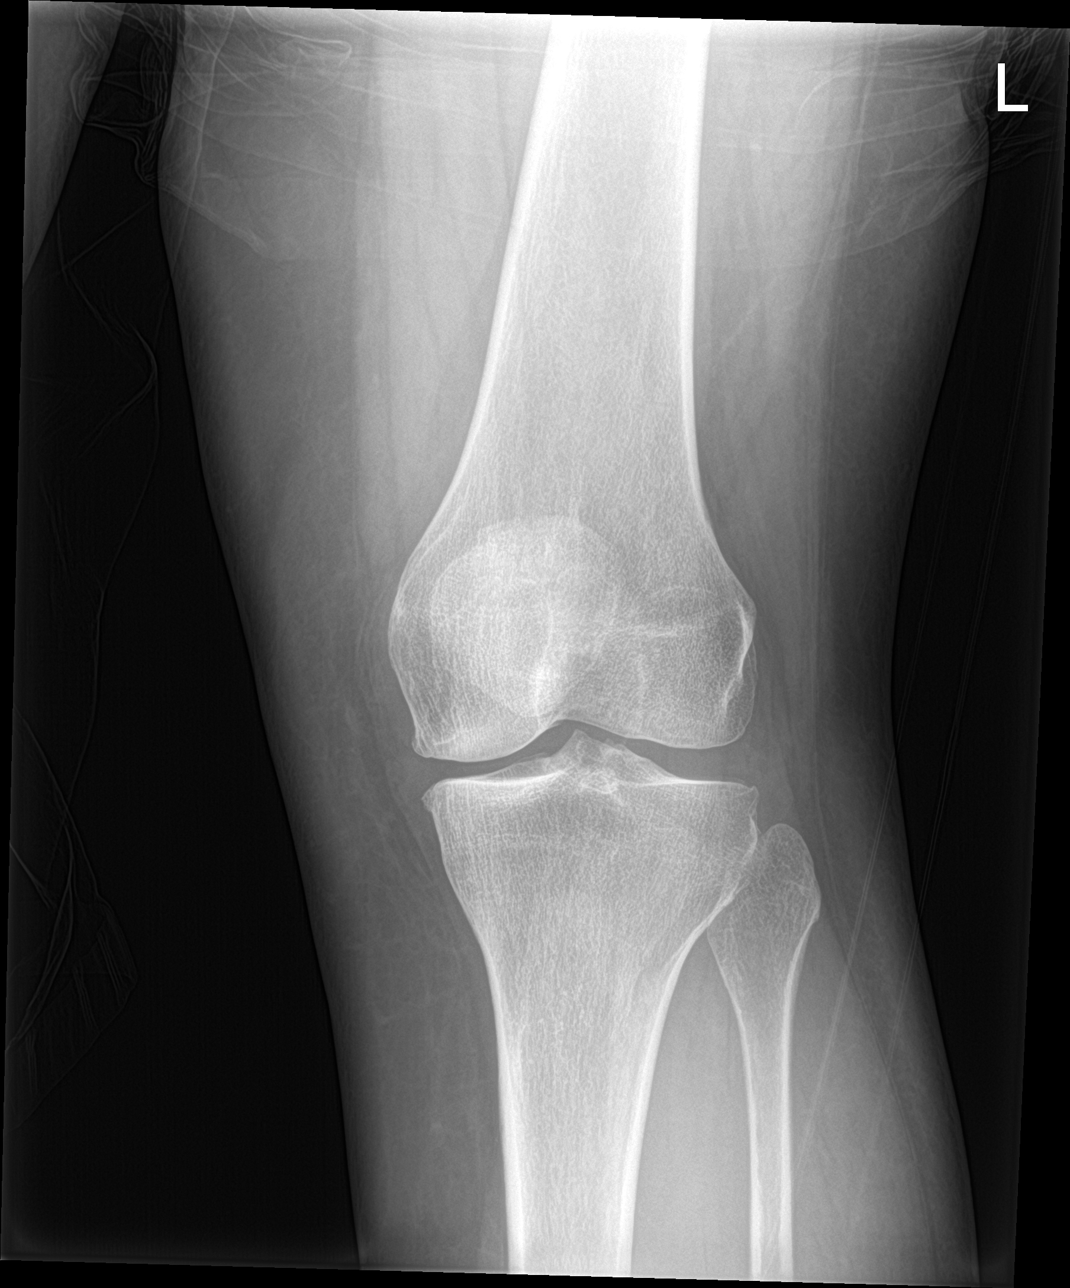

[knee obl (2 of 2)]
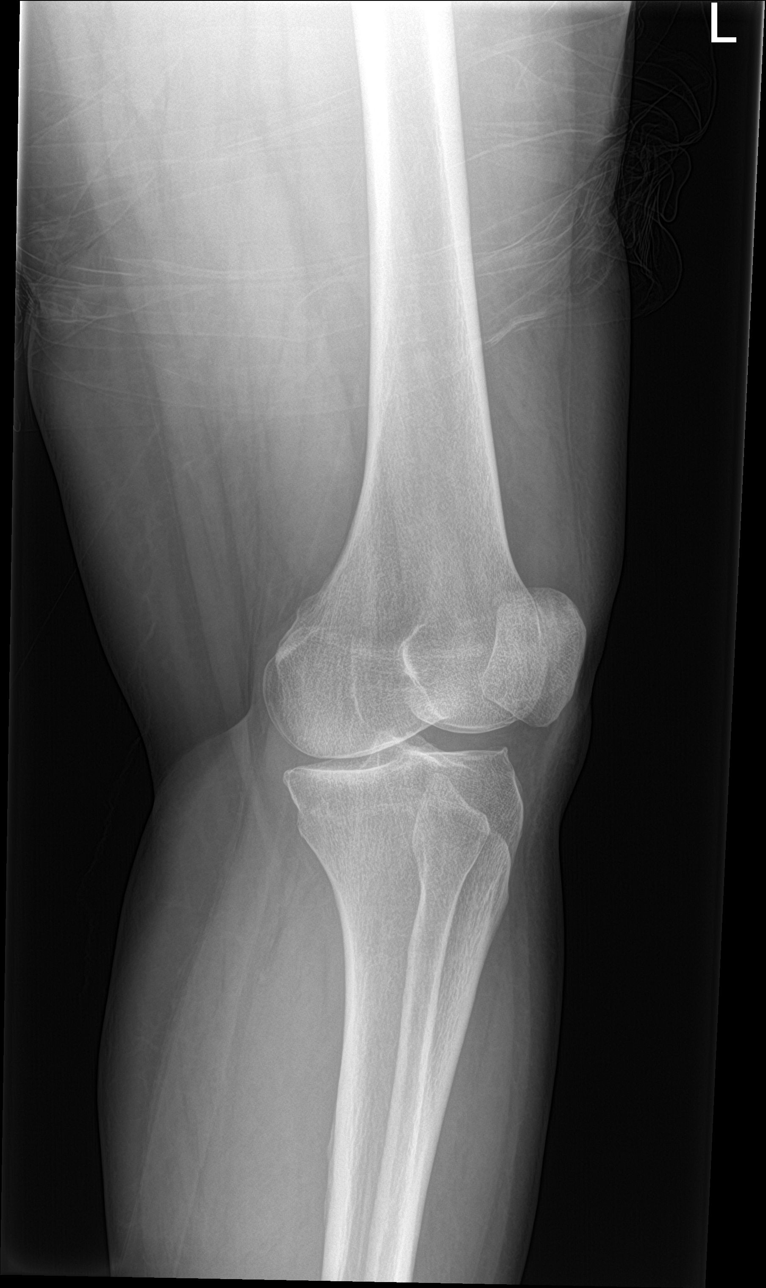

[4 of 4 positions shown; findings below may reference images not displayed]

FINDINGS: No evidence of fracture, dislocation, or joint effusion. No evidence
of arthropathy or other focal bone abnormality. Soft tissues are
unremarkable.
IMPRESSION: Negative.

## 2019-09-26 IMAGING — DX LEFT ANKLE - 2 VIEW
2 series · 2 of 2 positions shown · non-contrast
Comparison: None.

CLINICAL DATA: Left ankle pain after fall.

EXAM:
LEFT ANKLE - 2 VIEW

[ankle ap]
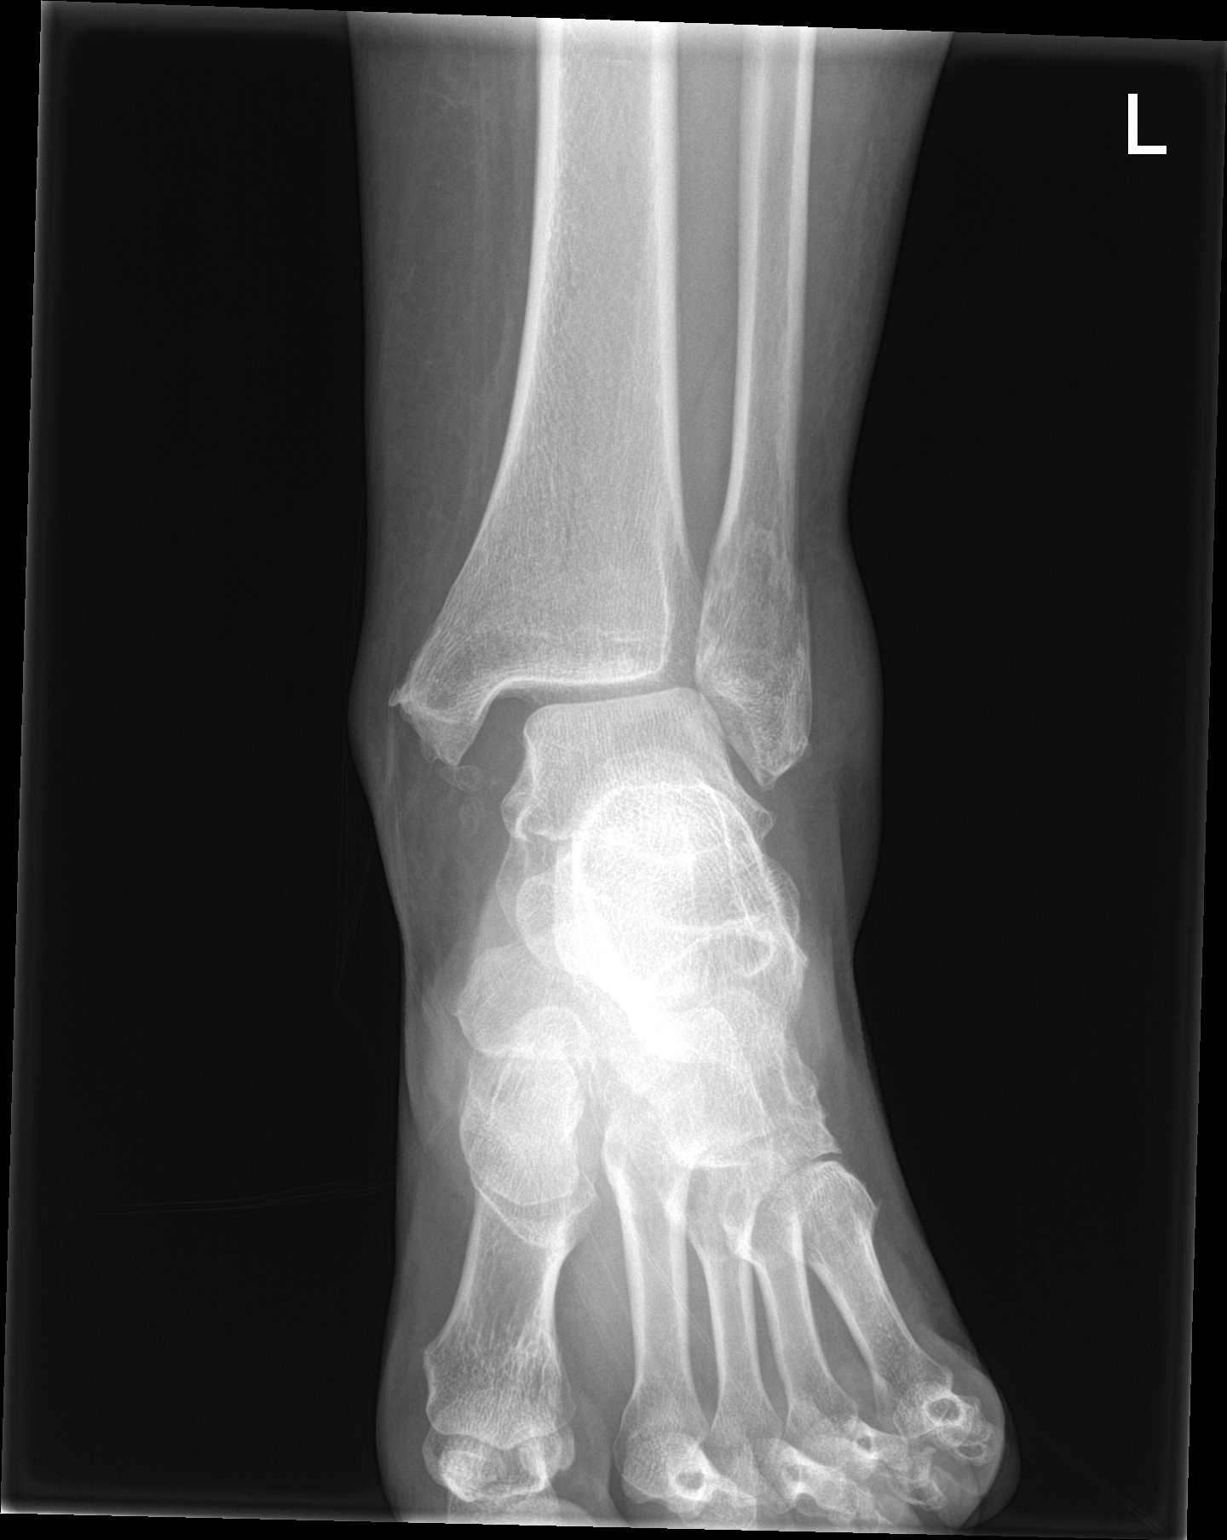

[ankle lat]
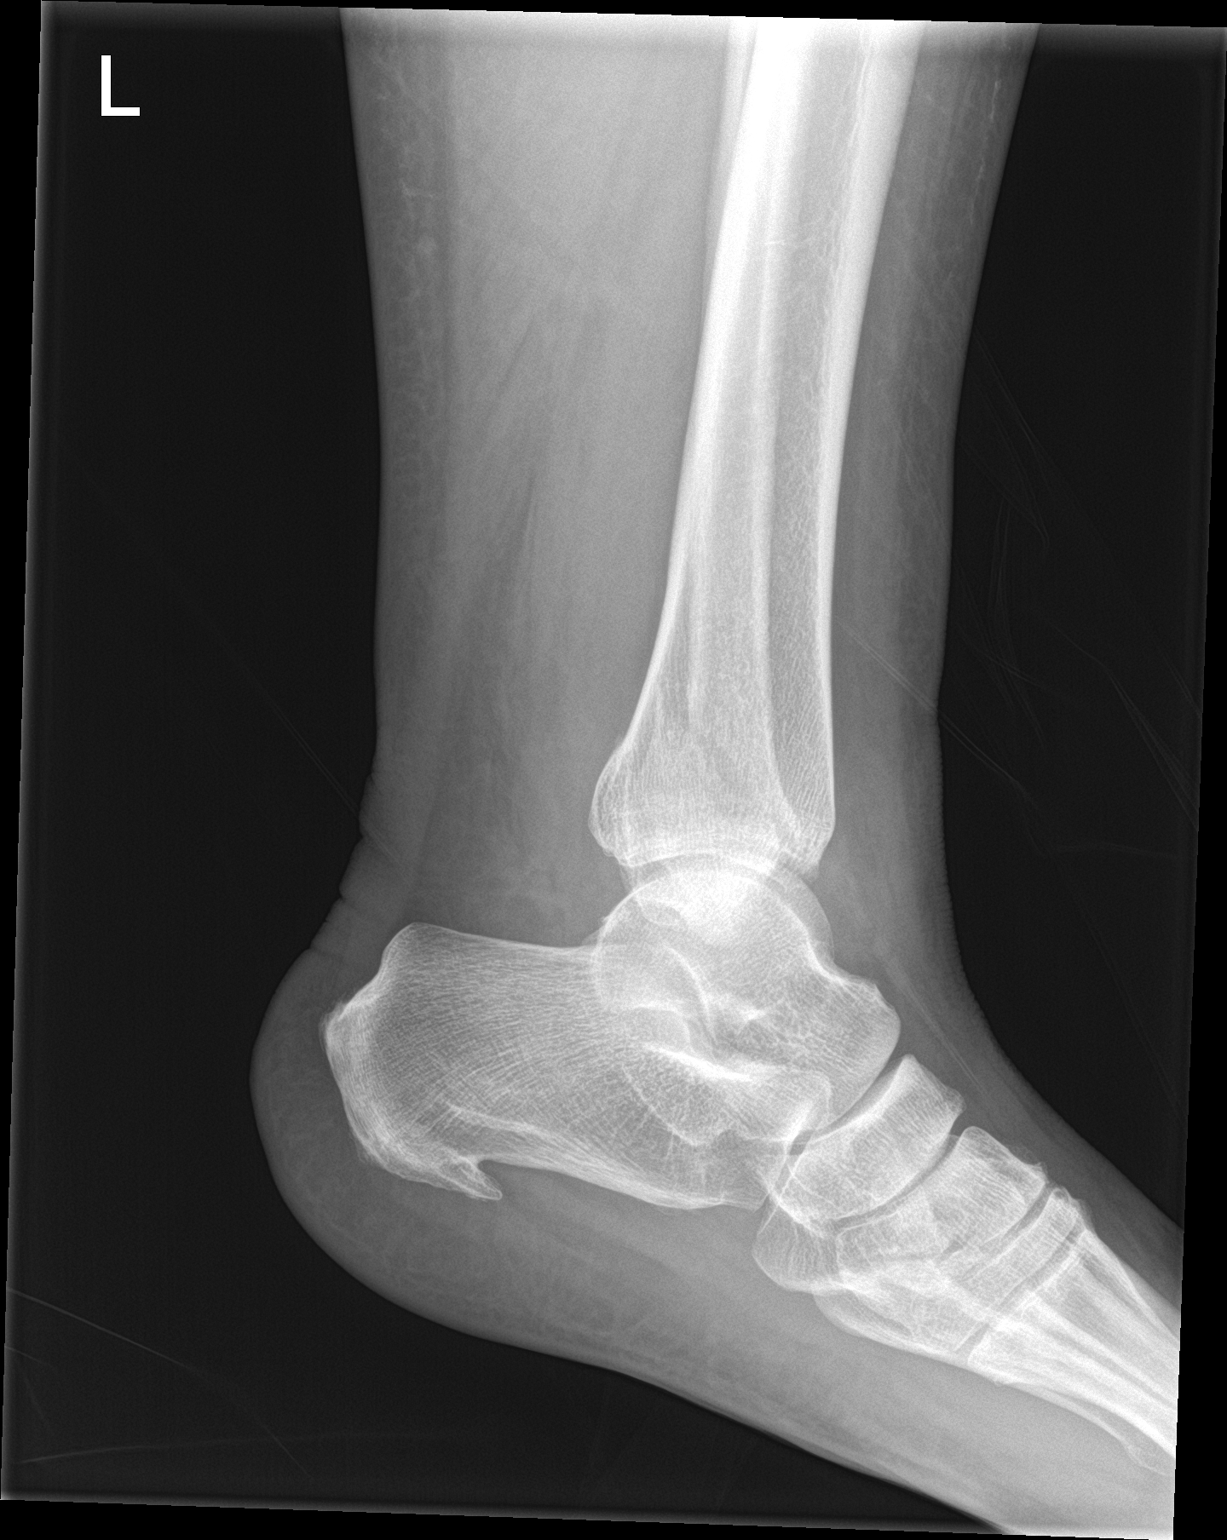

[2 of 2 positions shown; findings below may reference images not displayed]

FINDINGS: Moderately displaced oblique fracture is seen involving the distal
left fibula. Mild lateral subluxation of the talus relative to
distal tibia is noted.
IMPRESSION: Moderately displaced distal left fibular fracture. Mild lateral
subluxation of the talus relative to distal tibia.

## 2019-12-05 DIAGNOSIS — Z23 Encounter for immunization: Secondary | ICD-10-CM | POA: Diagnosis not present

## 2019-12-27 DIAGNOSIS — Z23 Encounter for immunization: Secondary | ICD-10-CM | POA: Diagnosis not present

## 2019-12-30 ENCOUNTER — Ambulatory Visit: Payer: BC Managed Care – PPO | Attending: Internal Medicine

## 2019-12-30 DIAGNOSIS — Z20822 Contact with and (suspected) exposure to covid-19: Secondary | ICD-10-CM | POA: Diagnosis not present

## 2019-12-31 LAB — SARS-COV-2, NAA 2 DAY TAT

## 2019-12-31 LAB — NOVEL CORONAVIRUS, NAA: SARS-CoV-2, NAA: NOT DETECTED

## 2020-02-06 DIAGNOSIS — F419 Anxiety disorder, unspecified: Secondary | ICD-10-CM | POA: Diagnosis not present

## 2020-02-06 DIAGNOSIS — E7849 Other hyperlipidemia: Secondary | ICD-10-CM | POA: Diagnosis not present

## 2020-02-06 DIAGNOSIS — Z23 Encounter for immunization: Secondary | ICD-10-CM | POA: Diagnosis not present

## 2020-02-06 DIAGNOSIS — Z6834 Body mass index (BMI) 34.0-34.9, adult: Secondary | ICD-10-CM | POA: Diagnosis not present

## 2020-02-06 DIAGNOSIS — I10 Essential (primary) hypertension: Secondary | ICD-10-CM | POA: Diagnosis not present

## 2020-02-06 DIAGNOSIS — Z1389 Encounter for screening for other disorder: Secondary | ICD-10-CM | POA: Diagnosis not present

## 2021-03-12 DIAGNOSIS — E6609 Other obesity due to excess calories: Secondary | ICD-10-CM | POA: Diagnosis not present

## 2021-03-12 DIAGNOSIS — I10 Essential (primary) hypertension: Secondary | ICD-10-CM | POA: Diagnosis not present

## 2021-03-12 DIAGNOSIS — F419 Anxiety disorder, unspecified: Secondary | ICD-10-CM | POA: Diagnosis not present

## 2021-03-12 DIAGNOSIS — Z6835 Body mass index (BMI) 35.0-35.9, adult: Secondary | ICD-10-CM | POA: Diagnosis not present

## 2021-03-12 DIAGNOSIS — E782 Mixed hyperlipidemia: Secondary | ICD-10-CM | POA: Diagnosis not present

## 2021-03-12 DIAGNOSIS — E7849 Other hyperlipidemia: Secondary | ICD-10-CM | POA: Diagnosis not present

## 2021-03-12 DIAGNOSIS — T464X5A Adverse effect of angiotensin-converting-enzyme inhibitors, initial encounter: Secondary | ICD-10-CM | POA: Diagnosis not present
